# Patient Record
Sex: Female | Born: 1940 | Race: White | Hispanic: No | Marital: Married | Smoking: Never smoker
Health system: Southern US, Community
[De-identification: ages and names within clinical notes are randomized; demographics above are authoritative.]

## PROBLEM LIST (undated history)

## (undated) DIAGNOSIS — I4891 Unspecified atrial fibrillation: Secondary | ICD-10-CM

## (undated) DIAGNOSIS — G8929 Other chronic pain: Secondary | ICD-10-CM

## (undated) DIAGNOSIS — H269 Unspecified cataract: Secondary | ICD-10-CM

## (undated) DIAGNOSIS — M810 Age-related osteoporosis without current pathological fracture: Secondary | ICD-10-CM

## (undated) DIAGNOSIS — M549 Dorsalgia, unspecified: Secondary | ICD-10-CM

## (undated) DIAGNOSIS — I509 Heart failure, unspecified: Secondary | ICD-10-CM

## (undated) DIAGNOSIS — N189 Chronic kidney disease, unspecified: Secondary | ICD-10-CM

## (undated) DIAGNOSIS — I495 Sick sinus syndrome: Secondary | ICD-10-CM

## (undated) DIAGNOSIS — M1990 Unspecified osteoarthritis, unspecified site: Secondary | ICD-10-CM

## (undated) DIAGNOSIS — J449 Chronic obstructive pulmonary disease, unspecified: Secondary | ICD-10-CM

## (undated) HISTORY — DX: Unspecified cataract: H26.9

## (undated) HISTORY — DX: Unspecified atrial fibrillation: I48.91

## (undated) HISTORY — PX: PACEMAKER PLACEMENT: SHX43

## (undated) HISTORY — DX: Sick sinus syndrome: I49.5

## (undated) HISTORY — DX: Dorsalgia, unspecified: M54.9

## (undated) HISTORY — PX: EYE SURGERY: SHX253

## (undated) HISTORY — DX: Age-related osteoporosis without current pathological fracture: M81.0

## (undated) HISTORY — DX: Other chronic pain: G89.29

## (undated) HISTORY — PX: ABDOMINAL HYSTERECTOMY: SHX81

## (undated) HISTORY — DX: Chronic kidney disease, unspecified: N18.9

## (undated) HISTORY — DX: Chronic obstructive pulmonary disease, unspecified: J44.9

## (undated) HISTORY — DX: Heart failure, unspecified: I50.9

## (undated) HISTORY — PX: OTHER SURGICAL HISTORY: SHX169

## (undated) HISTORY — DX: Unspecified osteoarthritis, unspecified site: M19.90

---

## 1998-04-24 ENCOUNTER — Inpatient Hospital Stay (HOSPITAL_COMMUNITY): Admission: RE | Admit: 1998-04-24 | Discharge: 1998-04-27 | Payer: Self-pay | Admitting: Gynecology

## 2015-04-25 DIAGNOSIS — I495 Sick sinus syndrome: Secondary | ICD-10-CM | POA: Insufficient documentation

## 2015-04-25 DIAGNOSIS — Z950 Presence of cardiac pacemaker: Secondary | ICD-10-CM | POA: Insufficient documentation

## 2015-11-06 DIAGNOSIS — Z45010 Encounter for checking and testing of cardiac pacemaker pulse generator [battery]: Secondary | ICD-10-CM | POA: Diagnosis not present

## 2015-11-06 DIAGNOSIS — I498 Other specified cardiac arrhythmias: Secondary | ICD-10-CM | POA: Diagnosis not present

## 2015-11-08 DIAGNOSIS — Z79899 Other long term (current) drug therapy: Secondary | ICD-10-CM | POA: Diagnosis not present

## 2015-11-08 DIAGNOSIS — G894 Chronic pain syndrome: Secondary | ICD-10-CM | POA: Diagnosis not present

## 2016-02-05 DIAGNOSIS — F1120 Opioid dependence, uncomplicated: Secondary | ICD-10-CM | POA: Diagnosis not present

## 2016-02-05 DIAGNOSIS — G8929 Other chronic pain: Secondary | ICD-10-CM | POA: Diagnosis not present

## 2016-02-05 DIAGNOSIS — M5416 Radiculopathy, lumbar region: Secondary | ICD-10-CM | POA: Diagnosis not present

## 2016-02-05 DIAGNOSIS — M419 Scoliosis, unspecified: Secondary | ICD-10-CM | POA: Diagnosis not present

## 2016-02-05 DIAGNOSIS — M130 Polyarthritis, unspecified: Secondary | ICD-10-CM | POA: Diagnosis not present

## 2016-02-05 DIAGNOSIS — G894 Chronic pain syndrome: Secondary | ICD-10-CM | POA: Diagnosis not present

## 2016-02-05 DIAGNOSIS — Z5181 Encounter for therapeutic drug level monitoring: Secondary | ICD-10-CM | POA: Diagnosis not present

## 2016-02-05 DIAGNOSIS — M5440 Lumbago with sciatica, unspecified side: Secondary | ICD-10-CM | POA: Diagnosis not present

## 2016-02-06 DIAGNOSIS — I498 Other specified cardiac arrhythmias: Secondary | ICD-10-CM | POA: Diagnosis not present

## 2016-02-06 DIAGNOSIS — Z45018 Encounter for adjustment and management of other part of cardiac pacemaker: Secondary | ICD-10-CM | POA: Diagnosis not present

## 2016-03-25 DIAGNOSIS — M130 Polyarthritis, unspecified: Secondary | ICD-10-CM | POA: Diagnosis not present

## 2016-03-25 DIAGNOSIS — M5440 Lumbago with sciatica, unspecified side: Secondary | ICD-10-CM | POA: Diagnosis not present

## 2016-03-25 DIAGNOSIS — M5416 Radiculopathy, lumbar region: Secondary | ICD-10-CM | POA: Diagnosis not present

## 2016-03-25 DIAGNOSIS — G894 Chronic pain syndrome: Secondary | ICD-10-CM | POA: Diagnosis not present

## 2016-03-25 DIAGNOSIS — F1120 Opioid dependence, uncomplicated: Secondary | ICD-10-CM | POA: Diagnosis not present

## 2016-03-25 DIAGNOSIS — Z79899 Other long term (current) drug therapy: Secondary | ICD-10-CM | POA: Diagnosis not present

## 2016-03-25 DIAGNOSIS — G8929 Other chronic pain: Secondary | ICD-10-CM | POA: Diagnosis not present

## 2016-03-25 DIAGNOSIS — M419 Scoliosis, unspecified: Secondary | ICD-10-CM | POA: Diagnosis not present

## 2016-03-25 DIAGNOSIS — G2581 Restless legs syndrome: Secondary | ICD-10-CM | POA: Diagnosis not present

## 2016-04-22 DIAGNOSIS — M5416 Radiculopathy, lumbar region: Secondary | ICD-10-CM | POA: Diagnosis not present

## 2016-04-22 DIAGNOSIS — M5440 Lumbago with sciatica, unspecified side: Secondary | ICD-10-CM | POA: Diagnosis not present

## 2016-04-22 DIAGNOSIS — G8929 Other chronic pain: Secondary | ICD-10-CM | POA: Diagnosis not present

## 2016-04-22 DIAGNOSIS — Z79899 Other long term (current) drug therapy: Secondary | ICD-10-CM | POA: Diagnosis not present

## 2016-04-22 DIAGNOSIS — M419 Scoliosis, unspecified: Secondary | ICD-10-CM | POA: Diagnosis not present

## 2016-04-22 DIAGNOSIS — M130 Polyarthritis, unspecified: Secondary | ICD-10-CM | POA: Diagnosis not present

## 2016-04-22 DIAGNOSIS — G2581 Restless legs syndrome: Secondary | ICD-10-CM | POA: Diagnosis not present

## 2016-04-22 DIAGNOSIS — F1120 Opioid dependence, uncomplicated: Secondary | ICD-10-CM | POA: Diagnosis not present

## 2016-04-22 DIAGNOSIS — G894 Chronic pain syndrome: Secondary | ICD-10-CM | POA: Diagnosis not present

## 2016-05-12 DIAGNOSIS — Z950 Presence of cardiac pacemaker: Secondary | ICD-10-CM | POA: Diagnosis not present

## 2016-05-20 DIAGNOSIS — M810 Age-related osteoporosis without current pathological fracture: Secondary | ICD-10-CM | POA: Diagnosis not present

## 2016-05-20 DIAGNOSIS — Z1389 Encounter for screening for other disorder: Secondary | ICD-10-CM | POA: Diagnosis not present

## 2016-05-20 DIAGNOSIS — Z6834 Body mass index (BMI) 34.0-34.9, adult: Secondary | ICD-10-CM | POA: Diagnosis not present

## 2016-05-20 DIAGNOSIS — G2581 Restless legs syndrome: Secondary | ICD-10-CM | POA: Diagnosis not present

## 2016-05-20 DIAGNOSIS — M419 Scoliosis, unspecified: Secondary | ICD-10-CM | POA: Diagnosis not present

## 2016-05-20 DIAGNOSIS — G894 Chronic pain syndrome: Secondary | ICD-10-CM | POA: Diagnosis not present

## 2016-05-20 DIAGNOSIS — I482 Chronic atrial fibrillation: Secondary | ICD-10-CM | POA: Diagnosis not present

## 2016-05-20 DIAGNOSIS — H3530 Unspecified macular degeneration: Secondary | ICD-10-CM | POA: Diagnosis not present

## 2016-05-20 DIAGNOSIS — M069 Rheumatoid arthritis, unspecified: Secondary | ICD-10-CM | POA: Diagnosis not present

## 2016-05-20 DIAGNOSIS — Z0000 Encounter for general adult medical examination without abnormal findings: Secondary | ICD-10-CM | POA: Diagnosis not present

## 2016-05-20 DIAGNOSIS — E7800 Pure hypercholesterolemia, unspecified: Secondary | ICD-10-CM | POA: Diagnosis not present

## 2016-05-28 DIAGNOSIS — H35342 Macular cyst, hole, or pseudohole, left eye: Secondary | ICD-10-CM | POA: Diagnosis not present

## 2016-06-03 DIAGNOSIS — F1120 Opioid dependence, uncomplicated: Secondary | ICD-10-CM | POA: Diagnosis not present

## 2016-06-03 DIAGNOSIS — M419 Scoliosis, unspecified: Secondary | ICD-10-CM | POA: Diagnosis not present

## 2016-06-03 DIAGNOSIS — M5416 Radiculopathy, lumbar region: Secondary | ICD-10-CM | POA: Diagnosis not present

## 2016-06-03 DIAGNOSIS — G2581 Restless legs syndrome: Secondary | ICD-10-CM | POA: Diagnosis not present

## 2016-06-03 DIAGNOSIS — M130 Polyarthritis, unspecified: Secondary | ICD-10-CM | POA: Diagnosis not present

## 2016-06-03 DIAGNOSIS — G8929 Other chronic pain: Secondary | ICD-10-CM | POA: Diagnosis not present

## 2016-06-03 DIAGNOSIS — Z79899 Other long term (current) drug therapy: Secondary | ICD-10-CM | POA: Diagnosis not present

## 2016-06-03 DIAGNOSIS — G894 Chronic pain syndrome: Secondary | ICD-10-CM | POA: Diagnosis not present

## 2016-06-03 DIAGNOSIS — M5440 Lumbago with sciatica, unspecified side: Secondary | ICD-10-CM | POA: Diagnosis not present

## 2016-06-05 DIAGNOSIS — M810 Age-related osteoporosis without current pathological fracture: Secondary | ICD-10-CM | POA: Diagnosis not present

## 2016-06-05 DIAGNOSIS — M8589 Other specified disorders of bone density and structure, multiple sites: Secondary | ICD-10-CM | POA: Diagnosis not present

## 2016-06-19 DIAGNOSIS — M818 Other osteoporosis without current pathological fracture: Secondary | ICD-10-CM | POA: Diagnosis not present

## 2016-06-25 DIAGNOSIS — I495 Sick sinus syndrome: Secondary | ICD-10-CM | POA: Diagnosis not present

## 2016-07-01 DIAGNOSIS — G894 Chronic pain syndrome: Secondary | ICD-10-CM | POA: Diagnosis not present

## 2016-07-01 DIAGNOSIS — M130 Polyarthritis, unspecified: Secondary | ICD-10-CM | POA: Diagnosis not present

## 2016-07-01 DIAGNOSIS — M5440 Lumbago with sciatica, unspecified side: Secondary | ICD-10-CM | POA: Diagnosis not present

## 2016-07-01 DIAGNOSIS — G8929 Other chronic pain: Secondary | ICD-10-CM | POA: Diagnosis not present

## 2016-07-01 DIAGNOSIS — M419 Scoliosis, unspecified: Secondary | ICD-10-CM | POA: Diagnosis not present

## 2016-07-01 DIAGNOSIS — G2581 Restless legs syndrome: Secondary | ICD-10-CM | POA: Diagnosis not present

## 2016-07-01 DIAGNOSIS — M5416 Radiculopathy, lumbar region: Secondary | ICD-10-CM | POA: Diagnosis not present

## 2016-07-01 DIAGNOSIS — F1120 Opioid dependence, uncomplicated: Secondary | ICD-10-CM | POA: Diagnosis not present

## 2016-07-01 DIAGNOSIS — Z79899 Other long term (current) drug therapy: Secondary | ICD-10-CM | POA: Diagnosis not present

## 2016-07-07 DIAGNOSIS — H25812 Combined forms of age-related cataract, left eye: Secondary | ICD-10-CM | POA: Diagnosis not present

## 2016-07-07 DIAGNOSIS — Z01818 Encounter for other preprocedural examination: Secondary | ICD-10-CM | POA: Diagnosis not present

## 2016-07-10 DIAGNOSIS — Z950 Presence of cardiac pacemaker: Secondary | ICD-10-CM | POA: Diagnosis not present

## 2016-07-10 DIAGNOSIS — Z23 Encounter for immunization: Secondary | ICD-10-CM | POA: Diagnosis not present

## 2016-07-10 DIAGNOSIS — I495 Sick sinus syndrome: Secondary | ICD-10-CM | POA: Diagnosis not present

## 2016-07-20 DIAGNOSIS — I482 Chronic atrial fibrillation, unspecified: Secondary | ICD-10-CM | POA: Insufficient documentation

## 2016-07-20 DIAGNOSIS — Z7901 Long term (current) use of anticoagulants: Secondary | ICD-10-CM | POA: Insufficient documentation

## 2016-07-20 DIAGNOSIS — R0602 Shortness of breath: Secondary | ICD-10-CM | POA: Insufficient documentation

## 2016-07-21 DIAGNOSIS — Z7901 Long term (current) use of anticoagulants: Secondary | ICD-10-CM | POA: Diagnosis not present

## 2016-07-21 DIAGNOSIS — I482 Chronic atrial fibrillation: Secondary | ICD-10-CM | POA: Diagnosis not present

## 2016-07-21 DIAGNOSIS — I5032 Chronic diastolic (congestive) heart failure: Secondary | ICD-10-CM | POA: Diagnosis not present

## 2016-07-21 DIAGNOSIS — R0602 Shortness of breath: Secondary | ICD-10-CM | POA: Diagnosis not present

## 2016-07-21 DIAGNOSIS — Z950 Presence of cardiac pacemaker: Secondary | ICD-10-CM | POA: Diagnosis not present

## 2016-08-03 DIAGNOSIS — I5032 Chronic diastolic (congestive) heart failure: Secondary | ICD-10-CM | POA: Diagnosis not present

## 2016-08-03 DIAGNOSIS — I482 Chronic atrial fibrillation: Secondary | ICD-10-CM | POA: Diagnosis not present

## 2016-08-03 DIAGNOSIS — Z950 Presence of cardiac pacemaker: Secondary | ICD-10-CM | POA: Diagnosis not present

## 2016-08-03 DIAGNOSIS — Z7901 Long term (current) use of anticoagulants: Secondary | ICD-10-CM | POA: Diagnosis not present

## 2016-08-05 DIAGNOSIS — M5416 Radiculopathy, lumbar region: Secondary | ICD-10-CM | POA: Diagnosis not present

## 2016-08-05 DIAGNOSIS — M419 Scoliosis, unspecified: Secondary | ICD-10-CM | POA: Diagnosis not present

## 2016-08-05 DIAGNOSIS — G2581 Restless legs syndrome: Secondary | ICD-10-CM | POA: Diagnosis not present

## 2016-08-05 DIAGNOSIS — F1120 Opioid dependence, uncomplicated: Secondary | ICD-10-CM | POA: Diagnosis not present

## 2016-08-05 DIAGNOSIS — M130 Polyarthritis, unspecified: Secondary | ICD-10-CM | POA: Diagnosis not present

## 2016-08-05 DIAGNOSIS — G8929 Other chronic pain: Secondary | ICD-10-CM | POA: Diagnosis not present

## 2016-08-05 DIAGNOSIS — G894 Chronic pain syndrome: Secondary | ICD-10-CM | POA: Diagnosis not present

## 2016-08-05 DIAGNOSIS — M5440 Lumbago with sciatica, unspecified side: Secondary | ICD-10-CM | POA: Diagnosis not present

## 2016-08-24 DIAGNOSIS — I4891 Unspecified atrial fibrillation: Secondary | ICD-10-CM | POA: Diagnosis not present

## 2016-09-02 DIAGNOSIS — M5416 Radiculopathy, lumbar region: Secondary | ICD-10-CM | POA: Diagnosis not present

## 2016-09-02 DIAGNOSIS — G894 Chronic pain syndrome: Secondary | ICD-10-CM | POA: Diagnosis not present

## 2016-09-02 DIAGNOSIS — M5440 Lumbago with sciatica, unspecified side: Secondary | ICD-10-CM | POA: Diagnosis not present

## 2016-09-02 DIAGNOSIS — M130 Polyarthritis, unspecified: Secondary | ICD-10-CM | POA: Diagnosis not present

## 2016-09-02 DIAGNOSIS — M419 Scoliosis, unspecified: Secondary | ICD-10-CM | POA: Diagnosis not present

## 2016-09-02 DIAGNOSIS — F1120 Opioid dependence, uncomplicated: Secondary | ICD-10-CM | POA: Diagnosis not present

## 2016-09-02 DIAGNOSIS — G8929 Other chronic pain: Secondary | ICD-10-CM | POA: Diagnosis not present

## 2016-09-02 DIAGNOSIS — G2581 Restless legs syndrome: Secondary | ICD-10-CM | POA: Diagnosis not present

## 2016-09-15 DIAGNOSIS — Z01810 Encounter for preprocedural cardiovascular examination: Secondary | ICD-10-CM | POA: Insufficient documentation

## 2016-09-24 DIAGNOSIS — Z950 Presence of cardiac pacemaker: Secondary | ICD-10-CM | POA: Diagnosis not present

## 2016-09-24 DIAGNOSIS — Z45018 Encounter for adjustment and management of other part of cardiac pacemaker: Secondary | ICD-10-CM | POA: Diagnosis not present

## 2016-10-08 DIAGNOSIS — I482 Chronic atrial fibrillation: Secondary | ICD-10-CM | POA: Diagnosis not present

## 2016-10-08 DIAGNOSIS — Z950 Presence of cardiac pacemaker: Secondary | ICD-10-CM | POA: Diagnosis not present

## 2016-10-08 DIAGNOSIS — I5032 Chronic diastolic (congestive) heart failure: Secondary | ICD-10-CM | POA: Diagnosis not present

## 2016-10-08 DIAGNOSIS — Z01810 Encounter for preprocedural cardiovascular examination: Secondary | ICD-10-CM | POA: Diagnosis not present

## 2016-10-08 DIAGNOSIS — Z7901 Long term (current) use of anticoagulants: Secondary | ICD-10-CM | POA: Diagnosis not present

## 2016-10-26 DIAGNOSIS — I4891 Unspecified atrial fibrillation: Secondary | ICD-10-CM | POA: Diagnosis not present

## 2016-11-03 DIAGNOSIS — H35342 Macular cyst, hole, or pseudohole, left eye: Secondary | ICD-10-CM | POA: Diagnosis not present

## 2016-11-04 DIAGNOSIS — M5440 Lumbago with sciatica, unspecified side: Secondary | ICD-10-CM | POA: Diagnosis not present

## 2016-11-04 DIAGNOSIS — M419 Scoliosis, unspecified: Secondary | ICD-10-CM | POA: Diagnosis not present

## 2016-11-04 DIAGNOSIS — M130 Polyarthritis, unspecified: Secondary | ICD-10-CM | POA: Diagnosis not present

## 2016-11-04 DIAGNOSIS — M5416 Radiculopathy, lumbar region: Secondary | ICD-10-CM | POA: Diagnosis not present

## 2016-11-04 DIAGNOSIS — G2581 Restless legs syndrome: Secondary | ICD-10-CM | POA: Diagnosis not present

## 2016-11-04 DIAGNOSIS — F1120 Opioid dependence, uncomplicated: Secondary | ICD-10-CM | POA: Diagnosis not present

## 2016-11-04 DIAGNOSIS — G8929 Other chronic pain: Secondary | ICD-10-CM | POA: Diagnosis not present

## 2016-11-04 DIAGNOSIS — G894 Chronic pain syndrome: Secondary | ICD-10-CM | POA: Diagnosis not present

## 2016-11-25 DIAGNOSIS — I5032 Chronic diastolic (congestive) heart failure: Secondary | ICD-10-CM | POA: Diagnosis not present

## 2016-11-25 DIAGNOSIS — G894 Chronic pain syndrome: Secondary | ICD-10-CM | POA: Diagnosis not present

## 2016-11-25 DIAGNOSIS — I4891 Unspecified atrial fibrillation: Secondary | ICD-10-CM | POA: Diagnosis not present

## 2016-12-07 DIAGNOSIS — Z961 Presence of intraocular lens: Secondary | ICD-10-CM | POA: Diagnosis not present

## 2016-12-07 DIAGNOSIS — H25811 Combined forms of age-related cataract, right eye: Secondary | ICD-10-CM | POA: Diagnosis not present

## 2016-12-07 DIAGNOSIS — H35342 Macular cyst, hole, or pseudohole, left eye: Secondary | ICD-10-CM | POA: Diagnosis not present

## 2016-12-25 DIAGNOSIS — Z950 Presence of cardiac pacemaker: Secondary | ICD-10-CM | POA: Diagnosis not present

## 2016-12-30 DIAGNOSIS — M5440 Lumbago with sciatica, unspecified side: Secondary | ICD-10-CM | POA: Diagnosis not present

## 2016-12-30 DIAGNOSIS — M5416 Radiculopathy, lumbar region: Secondary | ICD-10-CM | POA: Diagnosis not present

## 2016-12-30 DIAGNOSIS — F1120 Opioid dependence, uncomplicated: Secondary | ICD-10-CM | POA: Diagnosis not present

## 2016-12-30 DIAGNOSIS — M419 Scoliosis, unspecified: Secondary | ICD-10-CM | POA: Diagnosis not present

## 2016-12-30 DIAGNOSIS — M130 Polyarthritis, unspecified: Secondary | ICD-10-CM | POA: Diagnosis not present

## 2016-12-30 DIAGNOSIS — Z79899 Other long term (current) drug therapy: Secondary | ICD-10-CM | POA: Diagnosis not present

## 2016-12-30 DIAGNOSIS — G2581 Restless legs syndrome: Secondary | ICD-10-CM | POA: Diagnosis not present

## 2016-12-30 DIAGNOSIS — G894 Chronic pain syndrome: Secondary | ICD-10-CM | POA: Diagnosis not present

## 2016-12-30 DIAGNOSIS — G8929 Other chronic pain: Secondary | ICD-10-CM | POA: Diagnosis not present

## 2017-02-11 DIAGNOSIS — H35342 Macular cyst, hole, or pseudohole, left eye: Secondary | ICD-10-CM | POA: Diagnosis not present

## 2017-03-03 DIAGNOSIS — F411 Generalized anxiety disorder: Secondary | ICD-10-CM | POA: Diagnosis not present

## 2017-03-03 DIAGNOSIS — F330 Major depressive disorder, recurrent, mild: Secondary | ICD-10-CM | POA: Diagnosis not present

## 2017-03-26 DIAGNOSIS — Z950 Presence of cardiac pacemaker: Secondary | ICD-10-CM | POA: Diagnosis not present

## 2017-03-31 DIAGNOSIS — M130 Polyarthritis, unspecified: Secondary | ICD-10-CM | POA: Diagnosis not present

## 2017-03-31 DIAGNOSIS — G2581 Restless legs syndrome: Secondary | ICD-10-CM | POA: Diagnosis not present

## 2017-03-31 DIAGNOSIS — M5416 Radiculopathy, lumbar region: Secondary | ICD-10-CM | POA: Diagnosis not present

## 2017-03-31 DIAGNOSIS — G8929 Other chronic pain: Secondary | ICD-10-CM | POA: Diagnosis not present

## 2017-03-31 DIAGNOSIS — G894 Chronic pain syndrome: Secondary | ICD-10-CM | POA: Diagnosis not present

## 2017-03-31 DIAGNOSIS — M419 Scoliosis, unspecified: Secondary | ICD-10-CM | POA: Diagnosis not present

## 2017-03-31 DIAGNOSIS — M5440 Lumbago with sciatica, unspecified side: Secondary | ICD-10-CM | POA: Diagnosis not present

## 2017-03-31 DIAGNOSIS — F1120 Opioid dependence, uncomplicated: Secondary | ICD-10-CM | POA: Diagnosis not present

## 2017-04-01 DIAGNOSIS — I495 Sick sinus syndrome: Secondary | ICD-10-CM | POA: Diagnosis not present

## 2017-04-01 DIAGNOSIS — I5033 Acute on chronic diastolic (congestive) heart failure: Secondary | ICD-10-CM | POA: Insufficient documentation

## 2017-04-01 DIAGNOSIS — I482 Chronic atrial fibrillation: Secondary | ICD-10-CM | POA: Diagnosis not present

## 2017-04-01 DIAGNOSIS — R5382 Chronic fatigue, unspecified: Secondary | ICD-10-CM | POA: Diagnosis not present

## 2017-04-01 DIAGNOSIS — I5032 Chronic diastolic (congestive) heart failure: Secondary | ICD-10-CM | POA: Diagnosis not present

## 2017-04-01 DIAGNOSIS — Z950 Presence of cardiac pacemaker: Secondary | ICD-10-CM | POA: Diagnosis not present

## 2017-04-01 DIAGNOSIS — Z7901 Long term (current) use of anticoagulants: Secondary | ICD-10-CM | POA: Diagnosis not present

## 2017-04-12 DIAGNOSIS — I509 Heart failure, unspecified: Secondary | ICD-10-CM | POA: Diagnosis not present

## 2017-06-01 DIAGNOSIS — H35342 Macular cyst, hole, or pseudohole, left eye: Secondary | ICD-10-CM | POA: Diagnosis not present

## 2017-06-07 DIAGNOSIS — R9431 Abnormal electrocardiogram [ECG] [EKG]: Secondary | ICD-10-CM | POA: Diagnosis not present

## 2017-06-07 DIAGNOSIS — I4891 Unspecified atrial fibrillation: Secondary | ICD-10-CM | POA: Diagnosis not present

## 2017-06-21 DIAGNOSIS — Z0000 Encounter for general adult medical examination without abnormal findings: Secondary | ICD-10-CM | POA: Diagnosis not present

## 2017-06-21 DIAGNOSIS — F411 Generalized anxiety disorder: Secondary | ICD-10-CM | POA: Diagnosis not present

## 2017-06-21 DIAGNOSIS — E7800 Pure hypercholesterolemia, unspecified: Secondary | ICD-10-CM | POA: Diagnosis not present

## 2017-06-21 DIAGNOSIS — Z23 Encounter for immunization: Secondary | ICD-10-CM | POA: Diagnosis not present

## 2017-06-21 DIAGNOSIS — I4891 Unspecified atrial fibrillation: Secondary | ICD-10-CM | POA: Diagnosis not present

## 2017-06-21 DIAGNOSIS — Z1389 Encounter for screening for other disorder: Secondary | ICD-10-CM | POA: Diagnosis not present

## 2017-06-23 DIAGNOSIS — Z79899 Other long term (current) drug therapy: Secondary | ICD-10-CM | POA: Diagnosis not present

## 2017-06-23 DIAGNOSIS — E7800 Pure hypercholesterolemia, unspecified: Secondary | ICD-10-CM | POA: Diagnosis not present

## 2017-06-23 DIAGNOSIS — G8929 Other chronic pain: Secondary | ICD-10-CM | POA: Diagnosis not present

## 2017-06-23 DIAGNOSIS — M5440 Lumbago with sciatica, unspecified side: Secondary | ICD-10-CM | POA: Diagnosis not present

## 2017-06-23 DIAGNOSIS — M419 Scoliosis, unspecified: Secondary | ICD-10-CM | POA: Diagnosis not present

## 2017-06-23 DIAGNOSIS — M5416 Radiculopathy, lumbar region: Secondary | ICD-10-CM | POA: Diagnosis not present

## 2017-06-23 DIAGNOSIS — G2581 Restless legs syndrome: Secondary | ICD-10-CM | POA: Diagnosis not present

## 2017-06-23 DIAGNOSIS — F1120 Opioid dependence, uncomplicated: Secondary | ICD-10-CM | POA: Diagnosis not present

## 2017-06-23 DIAGNOSIS — Z0000 Encounter for general adult medical examination without abnormal findings: Secondary | ICD-10-CM | POA: Diagnosis not present

## 2017-06-23 DIAGNOSIS — M130 Polyarthritis, unspecified: Secondary | ICD-10-CM | POA: Diagnosis not present

## 2017-06-23 DIAGNOSIS — F411 Generalized anxiety disorder: Secondary | ICD-10-CM | POA: Diagnosis not present

## 2017-06-23 DIAGNOSIS — I4891 Unspecified atrial fibrillation: Secondary | ICD-10-CM | POA: Diagnosis not present

## 2017-07-06 DIAGNOSIS — R000 Tachycardia, unspecified: Secondary | ICD-10-CM | POA: Diagnosis not present

## 2017-07-06 DIAGNOSIS — R0602 Shortness of breath: Secondary | ICD-10-CM | POA: Diagnosis not present

## 2017-07-06 DIAGNOSIS — N189 Chronic kidney disease, unspecified: Secondary | ICD-10-CM | POA: Diagnosis not present

## 2017-07-06 DIAGNOSIS — M810 Age-related osteoporosis without current pathological fracture: Secondary | ICD-10-CM | POA: Diagnosis not present

## 2017-07-06 DIAGNOSIS — I4891 Unspecified atrial fibrillation: Secondary | ICD-10-CM | POA: Diagnosis not present

## 2017-07-06 DIAGNOSIS — M7989 Other specified soft tissue disorders: Secondary | ICD-10-CM | POA: Diagnosis not present

## 2017-07-06 DIAGNOSIS — N179 Acute kidney failure, unspecified: Secondary | ICD-10-CM | POA: Diagnosis not present

## 2017-07-06 DIAGNOSIS — R0600 Dyspnea, unspecified: Secondary | ICD-10-CM | POA: Diagnosis not present

## 2017-07-06 DIAGNOSIS — Z79899 Other long term (current) drug therapy: Secondary | ICD-10-CM | POA: Diagnosis not present

## 2017-07-06 DIAGNOSIS — I509 Heart failure, unspecified: Secondary | ICD-10-CM | POA: Diagnosis not present

## 2017-07-06 DIAGNOSIS — M419 Scoliosis, unspecified: Secondary | ICD-10-CM | POA: Diagnosis not present

## 2017-07-06 DIAGNOSIS — R0789 Other chest pain: Secondary | ICD-10-CM | POA: Diagnosis not present

## 2017-07-06 DIAGNOSIS — Z950 Presence of cardiac pacemaker: Secondary | ICD-10-CM | POA: Diagnosis not present

## 2017-07-06 DIAGNOSIS — R069 Unspecified abnormalities of breathing: Secondary | ICD-10-CM | POA: Diagnosis not present

## 2017-07-06 DIAGNOSIS — Z7901 Long term (current) use of anticoagulants: Secondary | ICD-10-CM | POA: Diagnosis not present

## 2017-07-06 DIAGNOSIS — I081 Rheumatic disorders of both mitral and tricuspid valves: Secondary | ICD-10-CM | POA: Diagnosis not present

## 2017-07-06 DIAGNOSIS — R9431 Abnormal electrocardiogram [ECG] [EKG]: Secondary | ICD-10-CM | POA: Diagnosis not present

## 2017-07-06 DIAGNOSIS — I5033 Acute on chronic diastolic (congestive) heart failure: Secondary | ICD-10-CM | POA: Diagnosis not present

## 2017-07-07 DIAGNOSIS — N179 Acute kidney failure, unspecified: Secondary | ICD-10-CM | POA: Insufficient documentation

## 2017-07-07 DIAGNOSIS — R000 Tachycardia, unspecified: Secondary | ICD-10-CM | POA: Insufficient documentation

## 2017-07-08 DIAGNOSIS — Z45018 Encounter for adjustment and management of other part of cardiac pacemaker: Secondary | ICD-10-CM | POA: Diagnosis not present

## 2017-07-08 DIAGNOSIS — I5033 Acute on chronic diastolic (congestive) heart failure: Secondary | ICD-10-CM | POA: Diagnosis not present

## 2017-07-08 DIAGNOSIS — I420 Dilated cardiomyopathy: Secondary | ICD-10-CM | POA: Insufficient documentation

## 2017-07-08 DIAGNOSIS — I5032 Chronic diastolic (congestive) heart failure: Secondary | ICD-10-CM | POA: Diagnosis not present

## 2017-07-08 DIAGNOSIS — I481 Persistent atrial fibrillation: Secondary | ICD-10-CM | POA: Diagnosis not present

## 2017-07-08 DIAGNOSIS — I480 Paroxysmal atrial fibrillation: Secondary | ICD-10-CM | POA: Insufficient documentation

## 2017-07-27 DIAGNOSIS — I481 Persistent atrial fibrillation: Secondary | ICD-10-CM | POA: Diagnosis not present

## 2017-07-27 DIAGNOSIS — I5032 Chronic diastolic (congestive) heart failure: Secondary | ICD-10-CM | POA: Diagnosis not present

## 2017-07-27 DIAGNOSIS — I495 Sick sinus syndrome: Secondary | ICD-10-CM | POA: Diagnosis not present

## 2017-07-27 DIAGNOSIS — I493 Ventricular premature depolarization: Secondary | ICD-10-CM | POA: Diagnosis not present

## 2017-07-27 DIAGNOSIS — I420 Dilated cardiomyopathy: Secondary | ICD-10-CM | POA: Diagnosis not present

## 2017-08-16 DIAGNOSIS — Z79899 Other long term (current) drug therapy: Secondary | ICD-10-CM | POA: Diagnosis not present

## 2017-08-16 DIAGNOSIS — I495 Sick sinus syndrome: Secondary | ICD-10-CM | POA: Diagnosis not present

## 2017-08-18 DIAGNOSIS — M5416 Radiculopathy, lumbar region: Secondary | ICD-10-CM | POA: Diagnosis not present

## 2017-08-18 DIAGNOSIS — G2581 Restless legs syndrome: Secondary | ICD-10-CM | POA: Diagnosis not present

## 2017-08-18 DIAGNOSIS — M5440 Lumbago with sciatica, unspecified side: Secondary | ICD-10-CM | POA: Diagnosis not present

## 2017-08-18 DIAGNOSIS — M419 Scoliosis, unspecified: Secondary | ICD-10-CM | POA: Diagnosis not present

## 2017-08-18 DIAGNOSIS — G8929 Other chronic pain: Secondary | ICD-10-CM | POA: Diagnosis not present

## 2017-08-18 DIAGNOSIS — Z79899 Other long term (current) drug therapy: Secondary | ICD-10-CM | POA: Diagnosis not present

## 2017-08-18 DIAGNOSIS — M130 Polyarthritis, unspecified: Secondary | ICD-10-CM | POA: Diagnosis not present

## 2017-08-18 DIAGNOSIS — F1120 Opioid dependence, uncomplicated: Secondary | ICD-10-CM | POA: Diagnosis not present

## 2017-09-04 DIAGNOSIS — R05 Cough: Secondary | ICD-10-CM | POA: Diagnosis not present

## 2017-09-04 DIAGNOSIS — J309 Allergic rhinitis, unspecified: Secondary | ICD-10-CM | POA: Diagnosis not present

## 2017-10-18 DIAGNOSIS — I480 Paroxysmal atrial fibrillation: Secondary | ICD-10-CM | POA: Diagnosis not present

## 2017-10-19 DIAGNOSIS — Z950 Presence of cardiac pacemaker: Secondary | ICD-10-CM | POA: Diagnosis not present

## 2017-10-19 DIAGNOSIS — I4891 Unspecified atrial fibrillation: Secondary | ICD-10-CM | POA: Diagnosis not present

## 2017-10-19 DIAGNOSIS — I481 Persistent atrial fibrillation: Secondary | ICD-10-CM | POA: Diagnosis not present

## 2017-11-10 DIAGNOSIS — G2581 Restless legs syndrome: Secondary | ICD-10-CM | POA: Diagnosis not present

## 2017-11-10 DIAGNOSIS — M5416 Radiculopathy, lumbar region: Secondary | ICD-10-CM | POA: Diagnosis not present

## 2017-11-10 DIAGNOSIS — M419 Scoliosis, unspecified: Secondary | ICD-10-CM | POA: Diagnosis not present

## 2017-11-10 DIAGNOSIS — M5440 Lumbago with sciatica, unspecified side: Secondary | ICD-10-CM | POA: Diagnosis not present

## 2017-11-10 DIAGNOSIS — Z79899 Other long term (current) drug therapy: Secondary | ICD-10-CM | POA: Diagnosis not present

## 2017-11-10 DIAGNOSIS — G8929 Other chronic pain: Secondary | ICD-10-CM | POA: Diagnosis not present

## 2017-11-10 DIAGNOSIS — F1120 Opioid dependence, uncomplicated: Secondary | ICD-10-CM | POA: Diagnosis not present

## 2017-11-10 DIAGNOSIS — M130 Polyarthritis, unspecified: Secondary | ICD-10-CM | POA: Diagnosis not present

## 2017-11-22 DIAGNOSIS — Z950 Presence of cardiac pacemaker: Secondary | ICD-10-CM | POA: Diagnosis not present

## 2017-12-07 DIAGNOSIS — Z961 Presence of intraocular lens: Secondary | ICD-10-CM | POA: Diagnosis not present

## 2017-12-07 DIAGNOSIS — H25811 Combined forms of age-related cataract, right eye: Secondary | ICD-10-CM | POA: Diagnosis not present

## 2017-12-07 DIAGNOSIS — H35342 Macular cyst, hole, or pseudohole, left eye: Secondary | ICD-10-CM | POA: Diagnosis not present

## 2017-12-08 DIAGNOSIS — I5033 Acute on chronic diastolic (congestive) heart failure: Secondary | ICD-10-CM | POA: Diagnosis not present

## 2017-12-08 DIAGNOSIS — I420 Dilated cardiomyopathy: Secondary | ICD-10-CM | POA: Diagnosis not present

## 2017-12-08 DIAGNOSIS — I5032 Chronic diastolic (congestive) heart failure: Secondary | ICD-10-CM | POA: Diagnosis not present

## 2017-12-08 DIAGNOSIS — I481 Persistent atrial fibrillation: Secondary | ICD-10-CM | POA: Diagnosis not present

## 2017-12-08 DIAGNOSIS — I495 Sick sinus syndrome: Secondary | ICD-10-CM | POA: Diagnosis not present

## 2018-01-13 DIAGNOSIS — I272 Pulmonary hypertension, unspecified: Secondary | ICD-10-CM | POA: Diagnosis not present

## 2018-01-13 DIAGNOSIS — I340 Nonrheumatic mitral (valve) insufficiency: Secondary | ICD-10-CM | POA: Diagnosis not present

## 2018-01-13 DIAGNOSIS — I481 Persistent atrial fibrillation: Secondary | ICD-10-CM | POA: Diagnosis not present

## 2018-01-13 DIAGNOSIS — I517 Cardiomegaly: Secondary | ICD-10-CM | POA: Diagnosis not present

## 2018-02-09 DIAGNOSIS — Z79899 Other long term (current) drug therapy: Secondary | ICD-10-CM | POA: Diagnosis not present

## 2018-02-09 DIAGNOSIS — M419 Scoliosis, unspecified: Secondary | ICD-10-CM | POA: Diagnosis not present

## 2018-02-09 DIAGNOSIS — F1120 Opioid dependence, uncomplicated: Secondary | ICD-10-CM | POA: Diagnosis not present

## 2018-02-09 DIAGNOSIS — M5416 Radiculopathy, lumbar region: Secondary | ICD-10-CM | POA: Diagnosis not present

## 2018-02-09 DIAGNOSIS — G2581 Restless legs syndrome: Secondary | ICD-10-CM | POA: Diagnosis not present

## 2018-02-09 DIAGNOSIS — M130 Polyarthritis, unspecified: Secondary | ICD-10-CM | POA: Diagnosis not present

## 2018-02-09 DIAGNOSIS — G8929 Other chronic pain: Secondary | ICD-10-CM | POA: Diagnosis not present

## 2018-02-09 DIAGNOSIS — M5440 Lumbago with sciatica, unspecified side: Secondary | ICD-10-CM | POA: Diagnosis not present

## 2018-02-16 DIAGNOSIS — Z950 Presence of cardiac pacemaker: Secondary | ICD-10-CM | POA: Diagnosis not present

## 2018-02-16 DIAGNOSIS — Z79899 Other long term (current) drug therapy: Secondary | ICD-10-CM | POA: Diagnosis not present

## 2018-02-16 DIAGNOSIS — R0602 Shortness of breath: Secondary | ICD-10-CM | POA: Diagnosis not present

## 2018-02-16 DIAGNOSIS — R002 Palpitations: Secondary | ICD-10-CM | POA: Diagnosis not present

## 2018-02-16 DIAGNOSIS — R069 Unspecified abnormalities of breathing: Secondary | ICD-10-CM | POA: Diagnosis not present

## 2018-02-18 DIAGNOSIS — Z950 Presence of cardiac pacemaker: Secondary | ICD-10-CM | POA: Diagnosis not present

## 2018-03-09 DIAGNOSIS — I481 Persistent atrial fibrillation: Secondary | ICD-10-CM | POA: Diagnosis not present

## 2018-03-09 DIAGNOSIS — Z45018 Encounter for adjustment and management of other part of cardiac pacemaker: Secondary | ICD-10-CM | POA: Diagnosis not present

## 2018-03-09 DIAGNOSIS — N179 Acute kidney failure, unspecified: Secondary | ICD-10-CM | POA: Diagnosis not present

## 2018-03-09 DIAGNOSIS — I495 Sick sinus syndrome: Secondary | ICD-10-CM | POA: Diagnosis not present

## 2018-03-10 DIAGNOSIS — I495 Sick sinus syndrome: Secondary | ICD-10-CM | POA: Diagnosis not present

## 2018-04-13 DIAGNOSIS — I481 Persistent atrial fibrillation: Secondary | ICD-10-CM | POA: Diagnosis not present

## 2018-04-13 DIAGNOSIS — I130 Hypertensive heart and chronic kidney disease with heart failure and stage 1 through stage 4 chronic kidney disease, or unspecified chronic kidney disease: Secondary | ICD-10-CM | POA: Diagnosis not present

## 2018-04-13 DIAGNOSIS — G894 Chronic pain syndrome: Secondary | ICD-10-CM | POA: Diagnosis not present

## 2018-04-13 DIAGNOSIS — M1990 Unspecified osteoarthritis, unspecified site: Secondary | ICD-10-CM | POA: Diagnosis not present

## 2018-04-13 DIAGNOSIS — R0602 Shortness of breath: Secondary | ICD-10-CM | POA: Diagnosis not present

## 2018-04-13 DIAGNOSIS — J441 Chronic obstructive pulmonary disease with (acute) exacerbation: Secondary | ICD-10-CM | POA: Diagnosis not present

## 2018-04-13 DIAGNOSIS — I081 Rheumatic disorders of both mitral and tricuspid valves: Secondary | ICD-10-CM | POA: Diagnosis not present

## 2018-04-13 DIAGNOSIS — R918 Other nonspecific abnormal finding of lung field: Secondary | ICD-10-CM | POA: Diagnosis not present

## 2018-04-13 DIAGNOSIS — I4891 Unspecified atrial fibrillation: Secondary | ICD-10-CM | POA: Diagnosis not present

## 2018-04-13 DIAGNOSIS — Z79899 Other long term (current) drug therapy: Secondary | ICD-10-CM | POA: Diagnosis not present

## 2018-04-13 DIAGNOSIS — T502X5A Adverse effect of carbonic-anhydrase inhibitors, benzothiadiazides and other diuretics, initial encounter: Secondary | ICD-10-CM | POA: Diagnosis not present

## 2018-04-13 DIAGNOSIS — I5031 Acute diastolic (congestive) heart failure: Secondary | ICD-10-CM | POA: Diagnosis not present

## 2018-04-13 DIAGNOSIS — I952 Hypotension due to drugs: Secondary | ICD-10-CM | POA: Diagnosis not present

## 2018-04-13 DIAGNOSIS — T461X5A Adverse effect of calcium-channel blockers, initial encounter: Secondary | ICD-10-CM | POA: Diagnosis not present

## 2018-04-13 DIAGNOSIS — J069 Acute upper respiratory infection, unspecified: Secondary | ICD-10-CM | POA: Diagnosis not present

## 2018-04-13 DIAGNOSIS — G8929 Other chronic pain: Secondary | ICD-10-CM | POA: Diagnosis not present

## 2018-04-13 DIAGNOSIS — Z7409 Other reduced mobility: Secondary | ICD-10-CM | POA: Diagnosis not present

## 2018-04-13 DIAGNOSIS — Z833 Family history of diabetes mellitus: Secondary | ICD-10-CM | POA: Diagnosis not present

## 2018-04-13 DIAGNOSIS — J90 Pleural effusion, not elsewhere classified: Secondary | ICD-10-CM | POA: Diagnosis not present

## 2018-04-13 DIAGNOSIS — M069 Rheumatoid arthritis, unspecified: Secondary | ICD-10-CM | POA: Diagnosis not present

## 2018-04-13 DIAGNOSIS — N179 Acute kidney failure, unspecified: Secondary | ICD-10-CM | POA: Diagnosis not present

## 2018-04-13 DIAGNOSIS — I5033 Acute on chronic diastolic (congestive) heart failure: Secondary | ICD-10-CM | POA: Diagnosis not present

## 2018-04-13 DIAGNOSIS — I2729 Other secondary pulmonary hypertension: Secondary | ICD-10-CM | POA: Diagnosis not present

## 2018-04-13 DIAGNOSIS — Z8249 Family history of ischemic heart disease and other diseases of the circulatory system: Secondary | ICD-10-CM | POA: Diagnosis not present

## 2018-04-13 DIAGNOSIS — E785 Hyperlipidemia, unspecified: Secondary | ICD-10-CM | POA: Diagnosis not present

## 2018-04-13 DIAGNOSIS — M4120 Other idiopathic scoliosis, site unspecified: Secondary | ICD-10-CM | POA: Diagnosis not present

## 2018-04-13 DIAGNOSIS — N183 Chronic kidney disease, stage 3 (moderate): Secondary | ICD-10-CM | POA: Diagnosis not present

## 2018-04-13 DIAGNOSIS — Z950 Presence of cardiac pacemaker: Secondary | ICD-10-CM | POA: Diagnosis not present

## 2018-04-13 DIAGNOSIS — M810 Age-related osteoporosis without current pathological fracture: Secondary | ICD-10-CM | POA: Diagnosis not present

## 2018-04-13 DIAGNOSIS — R0601 Orthopnea: Secondary | ICD-10-CM | POA: Diagnosis not present

## 2018-04-13 DIAGNOSIS — I495 Sick sinus syndrome: Secondary | ICD-10-CM | POA: Diagnosis not present

## 2018-04-13 DIAGNOSIS — I482 Chronic atrial fibrillation: Secondary | ICD-10-CM | POA: Diagnosis not present

## 2018-04-13 DIAGNOSIS — R069 Unspecified abnormalities of breathing: Secondary | ICD-10-CM | POA: Diagnosis not present

## 2018-04-13 DIAGNOSIS — M4126 Other idiopathic scoliosis, lumbar region: Secondary | ICD-10-CM | POA: Diagnosis not present

## 2018-04-13 DIAGNOSIS — I5040 Unspecified combined systolic (congestive) and diastolic (congestive) heart failure: Secondary | ICD-10-CM | POA: Diagnosis not present

## 2018-04-13 DIAGNOSIS — J9801 Acute bronchospasm: Secondary | ICD-10-CM | POA: Diagnosis not present

## 2018-04-13 DIAGNOSIS — D649 Anemia, unspecified: Secondary | ICD-10-CM | POA: Diagnosis not present

## 2018-04-13 DIAGNOSIS — Z7901 Long term (current) use of anticoagulants: Secondary | ICD-10-CM | POA: Diagnosis not present

## 2018-04-13 DIAGNOSIS — I420 Dilated cardiomyopathy: Secondary | ICD-10-CM | POA: Diagnosis not present

## 2018-04-26 ENCOUNTER — Other Ambulatory Visit: Payer: Self-pay

## 2018-04-26 DIAGNOSIS — J441 Chronic obstructive pulmonary disease with (acute) exacerbation: Secondary | ICD-10-CM | POA: Insufficient documentation

## 2018-04-26 DIAGNOSIS — M549 Dorsalgia, unspecified: Secondary | ICD-10-CM

## 2018-04-26 DIAGNOSIS — G8929 Other chronic pain: Secondary | ICD-10-CM | POA: Insufficient documentation

## 2018-04-26 NOTE — Patient Outreach (Signed)
[place] [facility]) Care Management  04/26/2018  Kristen Harrison 17-Sep-1941 [id number]   Referral Date: 04/25/18 Referral Source: HTA report Date of Admission: 04/13/18 Diagnosis: CHF Date of Discharge: 04/22/18 Facility:  [place]: HTA  Outreach attempt # 1 spoke with patient.  She is able to verify HIPAA.  Patient reports that she is doing good since being discharged.  Patient reports  She is weighing daily and weight today was 159 lbs.  Patient reports that she is also watching her salt intake. Patient states that she is supposed to have home health but they have not come.  CM will follow up with home health. Patient reports that she has questions about her fluid restriction.  Patient has a fluid restriction of 1535ml per day.  Discussed with patient equivalent in ounces. Patient able to understand and thankful for the education.     Social: Patient lives alone but has 2 daughter that come to assist her and take her to appointments.  Conditions: Patient recently admitted with CHF.  Patient sees Dr. Ola Spurr for cardiology.  Patient also has COPD, A, Fib, and Chronic Kidney Disease.    Medications: Patient has her medications and is able to review.    Appointments: Patient did not have appointments set up.  Assisted patient is setting up appointments with PCP on Friday and Cardiologist on 05-04-18.    Consent: RN CM reviewed [facility] services with patient. Patient agreeable to services.   Plan:  [facility] CM Care Plan Problem One     Most Recent Value  Care Plan Problem One  Recent Hospitalization: Heart Failure  Role Documenting the Problem One  Care Management Telephonic Coordinator  Care Plan for Problem One  Active  THN Long Term Goal   Patient will not readmit to the hospital within 30 days.  THN Long Term Goal Start Date  04/26/18  Interventions for Problem One Long Term Goal  RN CM reviewed with patient heart failure zones.  Patient has heart failure  zone chart.  Discussed the importance of follow up appointments  THN CM Short Term Goal #1   Patient will report weighing daily and recording weights within 30 days.  THN CM Short Term Goal #1 Start Date  04/26/18  Interventions for Short Term Goal #1  RN CM discussed with patient weight perimeters and when to notify physician.    THN CM Short Term Goal #2   Patient will be able to verbalize signs of heart failure and when to notify physician within 30 days.  THN CM Short Term Goal #2 Start Date  04/26/18  Interventions for Short Term Goal #2  RN CM reviewed with patient signs of heart failure and stressed the importance of notifying physician of changes.        RN CM will send note and barriers letter to physician. RN CM will mail patient [facility] packet with heart failure information.   RN CM will contact patient next week and patient agrees to next outreach.    Home Health: [facility] [phone] they do have the initial order for home health but states that patient needs to see PCP before he will sign orders.  Advised them that patient has appointment with him on Friday.  They will plan to see patient on Saturday.    Telephone call to patient to advise of home health status.    Jone Baseman, RN, MSN [place] Management Care Management Coordinator Direct Line [phone] Toll Free: [phone]  Fax: [phone]

## 2018-04-29 DIAGNOSIS — I5032 Chronic diastolic (congestive) heart failure: Secondary | ICD-10-CM | POA: Diagnosis not present

## 2018-04-29 DIAGNOSIS — R531 Weakness: Secondary | ICD-10-CM | POA: Diagnosis not present

## 2018-05-03 ENCOUNTER — Other Ambulatory Visit: Payer: Self-pay | Admitting: *Deleted

## 2018-05-03 NOTE — Patient Outreach (Addendum)
[facility] [facility]) Care Management  05/03/2018  Hendrix Console 1940-12-14 [id number]   Transition of care, week 2 Referral Date: 04/25/18 Referral Source: HTA report Date of Admission: 04/13/18 Diagnosis: CHF Date of Discharge: 04/22/18 Facility:  [place]: Health team advantage   Outreach attempt #1 to home numbers listed in Epic - to follow up on home health , fluid restriction and kidney MD appointment  At [phone] there was not an answer and [facility] RN CM was unable to leave a voice message as mail box was not set up Call attempt to [phone] and a female informed THN RN CM she had the wrong number   Plan: [facility] RN CM will update assigned THN RN CM, D Leath an unsuccessful outreach letter was sent and attempt another call to Mrs Chesmore within 3 business days  [employer]. Lavina Hamman, RN, BSN, CCM [facility] Telephonic Care Management Care Coordinator Direct number [phone]  Main [facility] number [phone] Fax number [phone]

## 2018-05-04 DIAGNOSIS — Z7901 Long term (current) use of anticoagulants: Secondary | ICD-10-CM | POA: Diagnosis not present

## 2018-05-04 DIAGNOSIS — I420 Dilated cardiomyopathy: Secondary | ICD-10-CM | POA: Diagnosis not present

## 2018-05-04 DIAGNOSIS — N179 Acute kidney failure, unspecified: Secondary | ICD-10-CM | POA: Diagnosis not present

## 2018-05-04 DIAGNOSIS — Z45018 Encounter for adjustment and management of other part of cardiac pacemaker: Secondary | ICD-10-CM | POA: Diagnosis not present

## 2018-05-04 DIAGNOSIS — I480 Paroxysmal atrial fibrillation: Secondary | ICD-10-CM | POA: Diagnosis not present

## 2018-05-05 ENCOUNTER — Other Ambulatory Visit: Payer: Self-pay | Admitting: *Deleted

## 2018-05-05 DIAGNOSIS — I480 Paroxysmal atrial fibrillation: Secondary | ICD-10-CM | POA: Diagnosis not present

## 2018-05-05 NOTE — Patient Outreach (Signed)
[place] [facility]) Care Management  05/05/2018  Kristen Harrison 08/05/1941 [id number]   Transition of care, week 2 Referral Date:04/25/18 Referral Source:HTA report Date of Admission:04/13/18 Diagnosis:CHF Date of Discharge:04/22/18 Facility:[facility] Insurance: Health team advantage   Outreach attempt #1 to home numbers listed in Epic - to follow up on home health , fluid restriction and kidney MD appointment [facility] RN CM was able to reach Kristen Harrison today at [phone]  Kristen Harrison reports she is doing well and has been to see her primary MD("I believe it was on Tuesday" and her cardiologist (on May 04, 2018 "yesterday") this week with "good reports"  She has been maintaining her fluid restriction and wt today was 158.6 on her scale and on the cardilogoist scale on 05/04/18 it was 160.4 lbs. She states her cardiologist had to "slow my heart rate down twice and did a EKG. I did not like that" She denies s/s of CHF to include swelling of her ankles as the CHF yellow zone s/s were reviewed  Home Health She has seen her primary MD, Dr Nona Dell but has not heard from [facility] for home health. She reports concerns with MD office and at this time she is not sure she needs home health since she reports she is doing well with the assistance of her daughter who is present today also (and her husband)  Kidney MD f/u is scheduled for 05/25/18  Plan: [facility] RN CM will update assigned THN RN CM, D Leath to continue to follow up with Kristen Harrison and she agrees to follow up [facility] RN CM called [facility] [phone] and spoke with Zigmund Daniel at [place] who states [facility] had not received orders from pcp for services [facility] RN CM called to Dr Nona Dell office but found out on Thursdays the office closes at [place] Problem One     Most Recent Value  Care Plan Problem One  Recent Hospitalization: Heart Failure  Role Documenting the Problem One  Care Management Telephonic Coordinator   Care Plan for Problem One  Active  [facility] Long Term Goal   Patient will not readmit to the hospital within 30 days.  THN Long Term Goal Start Date  04/26/18  Interventions for Problem One Long Term Goal  RN CM assessed CHF s/s, re reviewed CHF yellow zone s/s and action plan  THN CM Short Term Goal #1   Patient will report weighing daily and recording weights within 30 days.  THN CM Short Term Goal #1 Start Date  04/26/18  Interventions for Short Term Goal #1  RN CM re review importance of calling MD if wt >3-5 lbs and commended her for following fluid restriction and monitoring for edema  THN CM Short Term Goal #2   Patient will be able to verbalize signs of heart failure and when to notify physician within 30 days.  THN CM Short Term Goal #2 Start Date  04/26/18  Interventions for Short Term Goal #2  RN CM re review importance of calling MD if wt >3-5 lbs and commended her for following fluid restriction and monitoring for edema      Kimberly L. Lavina Hamman, RN, BSN, CCM [facility] Telephonic Care Management Care Coordinator Direct number [phone]  Main [facility] number [phone] Fax number [phone]

## 2018-05-10 ENCOUNTER — Other Ambulatory Visit: Payer: Self-pay

## 2018-05-10 NOTE — Patient Outreach (Signed)
[place] [facility]) Care Management  [place]  05/10/2018   Kristen Harrison 1940-11-16 [id number]  Subjective: Telephone call to patient for last weekly phone call to transition of care.  Spoke with patient she is able to verify HIPAA.  Patient reports that she is doing good.  She reports her weight is stable only about a 1 lb difference up or down.  Patient able to described signs of active heart failure.  Stressed the importance of notifying physician.  Also reviewed with patient fluid restriction and sal intake.  She verbalized understanding and voices no concerns.    Objective:   Encounter Medications:  Outpatient Encounter Medications as of 05/10/2018  Medication Sig  . amiodarone (PACERONE) 200 MG tablet Take 200 mg by mouth daily.  Marland Kitchen CRANBERRY EXTRACT PO Take by mouth daily.  Marland Kitchen diltiazem (CARDIZEM CD) 120 MG 24 hr capsule Take by mouth.  . DOCOSAHEXAENOIC ACID PO Take by mouth.  . docusate sodium (COLACE) 100 MG capsule Take by mouth.  . furosemide (LASIX) 40 MG tablet TAKE 1 TABLET BY MOUTH EVERY DAY  . gabapentin (NEURONTIN) 300 MG capsule Take by mouth.  . Multiple Vitamin (MULTI-VITAMINS) TABS Take by mouth.  Marland Kitchen oxymorphone (OPANA) 5 MG tablet Take by mouth.  . Rivaroxaban (XARELTO) 15 MG TABS tablet Take by mouth.  . sertraline (ZOLOFT) 25 MG tablet Take by mouth.   No facility-administered encounter medications on file as of 05/10/2018.     Functional Status:  In your present state of health, do you have any difficulty performing the following activities: 04/26/2018  Hearing? N  Vision? N  Difficulty concentrating or making decisions? N  Walking or climbing stairs? Y  Dressing or bathing? N  Doing errands, shopping? Y  Preparing Food and eating ? N  Using the Toilet? N  In the past six months, have you accidently leaked urine? N  Do you have problems with loss of bowel control? N  Managing your Medications? N  Managing your Finances? N   Housekeeping or managing your Housekeeping? N  Some recent data might be hidden    Fall/Depression Screening: Fall Risk  04/26/2018  Falls in the past year? No   PHQ 2/9 Scores 04/26/2018  PHQ - 2 Score 0    Assessment: Patient continues to benefit from care manager outreach for disease management and support.    Plan:  [facility] CM Care Plan Problem One     Most Recent Value  Care Plan Problem One  Recent Hospitalization: Heart Failure  Role Documenting the Problem One  Care Management Telephonic Coordinator  Care Plan for Problem One  Active  THN Long Term Goal   Patient will not readmit to the hospital within 30 days.  THN Long Term Goal Start Date  04/26/18  Interventions for Problem One Long Term Goal  RN CM reiterated heart failure zones and importance of notifying physician of changes to prevent re-hospitalization.  THN CM Short Term Goal #1   Patient will report weighing daily and recording weights within 30 days.  THN CM Short Term Goal #1 Start Date  04/26/18  Interventions for Short Term Goal #1  RN CM reiterated with patient the importance of weights and notifying physician of weight gain of 3 lbs in a day or 5 lbs in a week.    THN CM Short Term Goal #2   Patient will be able to verbalize signs of heart failure and when to notify physician within 30 days.  THN CM Short Term Goal #2 Start Date  04/26/18  Interventions for Short Term Goal #2  RN CM reviewed with patient signs of heart failure.     RN CM will contact patient in the month of August and patient agrees to next outreach.    Jone Baseman, RN, MSN [place] Management Care Management Coordinator Direct Line [phone] Cell [phone] Toll Free: [phone]  Fax: [phone]

## 2018-05-18 DIAGNOSIS — F1120 Opioid dependence, uncomplicated: Secondary | ICD-10-CM | POA: Diagnosis not present

## 2018-05-18 DIAGNOSIS — G8929 Other chronic pain: Secondary | ICD-10-CM | POA: Diagnosis not present

## 2018-05-18 DIAGNOSIS — G2581 Restless legs syndrome: Secondary | ICD-10-CM | POA: Diagnosis not present

## 2018-05-18 DIAGNOSIS — M5416 Radiculopathy, lumbar region: Secondary | ICD-10-CM | POA: Diagnosis not present

## 2018-05-18 DIAGNOSIS — M130 Polyarthritis, unspecified: Secondary | ICD-10-CM | POA: Diagnosis not present

## 2018-05-18 DIAGNOSIS — M5440 Lumbago with sciatica, unspecified side: Secondary | ICD-10-CM | POA: Diagnosis not present

## 2018-05-18 DIAGNOSIS — Z79899 Other long term (current) drug therapy: Secondary | ICD-10-CM | POA: Diagnosis not present

## 2018-05-18 DIAGNOSIS — M419 Scoliosis, unspecified: Secondary | ICD-10-CM | POA: Diagnosis not present

## 2018-05-20 DIAGNOSIS — Z45010 Encounter for checking and testing of cardiac pacemaker pulse generator [battery]: Secondary | ICD-10-CM | POA: Diagnosis not present

## 2018-05-23 DIAGNOSIS — I5040 Unspecified combined systolic (congestive) and diastolic (congestive) heart failure: Secondary | ICD-10-CM | POA: Diagnosis not present

## 2018-05-25 ENCOUNTER — Ambulatory Visit: Payer: Self-pay

## 2018-05-25 DIAGNOSIS — N183 Chronic kidney disease, stage 3 unspecified: Secondary | ICD-10-CM | POA: Insufficient documentation

## 2018-05-25 DIAGNOSIS — I1310 Hypertensive heart and chronic kidney disease without heart failure, with stage 1 through stage 4 chronic kidney disease, or unspecified chronic kidney disease: Secondary | ICD-10-CM | POA: Diagnosis not present

## 2018-05-25 DIAGNOSIS — I129 Hypertensive chronic kidney disease with stage 1 through stage 4 chronic kidney disease, or unspecified chronic kidney disease: Secondary | ICD-10-CM | POA: Insufficient documentation

## 2018-05-25 DIAGNOSIS — N19 Unspecified kidney failure: Secondary | ICD-10-CM | POA: Diagnosis not present

## 2018-05-25 DIAGNOSIS — N179 Acute kidney failure, unspecified: Secondary | ICD-10-CM | POA: Diagnosis not present

## 2018-05-26 ENCOUNTER — Ambulatory Visit: Payer: Self-pay

## 2018-05-27 ENCOUNTER — Other Ambulatory Visit: Payer: Self-pay

## 2018-05-27 NOTE — Patient Outreach (Signed)
[place] [facility]) Care Management  [place]  05/27/2018   Kristen Harrison 1941-05-30 [id number]  Subjective: Telephone call to patient for follow up.  Patient able to verify HIPAA.  Patient reports she saw the Dr. Neta Ehlers the kidney doctor.  She states that he says her numbers were down and that it is possibly due to her not drinking enough.  She states he doubled her fluid restriction to 3000 ml a day.  She is happy about that.  She states that her last weight was 155 lbs.  She denies swelling and shortness of breath.  Discussed with patient heart failure symptoms and importance of notifying the physician.  She verbalized understanding.  Discussed with patient transfer to health coach for further disease management support.  Patient is in agreement.     Objective:   Encounter Medications:  Outpatient Encounter Medications as of 05/27/2018  Medication Sig  . amiodarone (PACERONE) 200 MG tablet Take 200 mg by mouth daily.  Marland Kitchen CRANBERRY EXTRACT PO Take by mouth daily.  Marland Kitchen diltiazem (CARDIZEM CD) 120 MG 24 hr capsule Take by mouth.  . DOCOSAHEXAENOIC ACID PO Take by mouth.  . docusate sodium (COLACE) 100 MG capsule Take by mouth.  . furosemide (LASIX) 40 MG tablet TAKE 1 TABLET BY MOUTH EVERY DAY  . gabapentin (NEURONTIN) 300 MG capsule Take by mouth.  . Multiple Vitamin (MULTI-VITAMINS) TABS Take by mouth.  Marland Kitchen oxymorphone (OPANA) 5 MG tablet Take by mouth.  . Rivaroxaban (XARELTO) 15 MG TABS tablet Take by mouth.  . sertraline (ZOLOFT) 25 MG tablet Take by mouth.   No facility-administered encounter medications on file as of 05/27/2018.     Functional Status:  In your present state of health, do you have any difficulty performing the following activities: 04/26/2018  Hearing? N  Vision? N  Difficulty concentrating or making decisions? N  Walking or climbing stairs? Y  Dressing or bathing? N  Doing errands, shopping? Y  Preparing Food and eating ? N  Using the  Toilet? N  In the past six months, have you accidently leaked urine? N  Do you have problems with loss of bowel control? N  Managing your Medications? N  Managing your Finances? N  Housekeeping or managing your Housekeeping? N  Some recent data might be hidden    Fall/Depression Screening: Fall Risk  04/26/2018  Falls in the past year? No   PHQ 2/9 Scores 04/26/2018  PHQ - 2 Score 0    Assessment: Patient continues to benefit from care manager outreach for disease management and support.    Plan:  [facility] CM Care Plan Problem One     Most Recent Value  Care Plan Problem One  Recent Hospitalization: Heart Failure  Role Documenting the Problem One  Care Management Telephonic Coordinator  Care Plan for Problem One  Active  THN Long Term Goal   Patient will not readmit to the hospital within 30 days.  THN Long Term Goal Start Date  04/26/18  THN Long Term Goal Met Date  05/27/18  Interventions for Problem One Long Term Goal  No re-hospitalizations  THN CM Short Term Goal #1   Patient will report weighing daily and recording weights within 30 days.  THN CM Short Term Goal #1 Start Date  04/26/18  THN CM Short Term Goal #1 Met Date  05/27/18  THN CM Short Term Goal #2   Patient will be able to verbalize signs of heart failure and when to  notify physician within 30 days.  THN CM Short Term Goal #2 Start Date  04/26/18     RN CM will transfer patient to health coach for disease management of heart failure.    Jone Baseman, RN, MSN [place] Management Care Management Coordinator Direct Line [phone] Cell [phone] Toll Free: [phone]  Fax: [phone]

## 2018-06-07 ENCOUNTER — Other Ambulatory Visit: Payer: Self-pay | Admitting: *Deleted

## 2018-06-07 NOTE — Patient Outreach (Signed)
[place] [facility]) Care Management  06/07/2018  Rylie Limburg [birth year] [id number]   RN Health Coach Introduction Call  Referral Date:  05/27/2018 Referral Source:  [facility] Screening Reason for Referral:  Disease Management Education Insurance:  [employer]   Outreach Attempt:  Outreach attempt #1 to patient for introductory call.  Telephone picked up with no response and hung up.   Plan:  RN Health Coach will send unsuccessful outreach letter to patient.  RN Health Coach will make another outreach attempt to patient within 3-4 business days if no return call back from patient.   [place] [phone] [email or web address]

## 2018-06-09 ENCOUNTER — Other Ambulatory Visit: Payer: Self-pay | Admitting: *Deleted

## 2018-06-09 NOTE — Patient Outreach (Signed)
[place] [facility]) Care Management  06/09/2018  Kristen Harrison Jun 22, 1941 [id number]   RN Health Coach Introduction Call  Referral Date:  05/27/2018 Referral Source:  [facility] Screening Reason for Referral:  Disease Management Education Insurance:  [employer]   Outreach Attempt:  Outreach attempt #2 to patient for introduction call. No answer and unable to leave voicemail message due to voicemail not set up.  Plan:  RN Health Coach will make another outreach attempt to patient within 3-4 business days if no return call back from patient.  [place] [phone] [email or web address]

## 2018-06-15 ENCOUNTER — Other Ambulatory Visit: Payer: Self-pay | Admitting: *Deleted

## 2018-06-15 NOTE — Patient Outreach (Signed)
[place] [facility]) Care Management  06/15/2018  Kristen Harrison Jul 23, 1941 [id number]   [place] Introduction Call  Referral Date:05/27/2018 Referral Source:TOC Screening Reason for Referral:Disease Management Education Insurance:[employer]   Outreach Attempt:  Outreach attempt #3 to patient for introduction call. No answer and unable to leave voicemail message due to voicemail not engaging.  Plan:  RN Health Coach will make another outreach attempt to patient within 10 business days if no return call back from patient.  [place] [phone] Artie [email or web address]

## 2018-06-22 ENCOUNTER — Other Ambulatory Visit: Payer: Self-pay | Admitting: *Deleted

## 2018-06-22 NOTE — Patient Outreach (Signed)
[place] [facility]) Care Management  06/22/2018  Kristen Harrison [birth year] [id number]   [place] Introduction Call  Referral Date:05/27/2018 Referral Source:TOC Screening Reason for Referral:Disease Management Education Insurance:[employer]   Outreach Attempt:  Outreach attempt #4 to patient for introduction call. No answer and unable to leave voicemail message due to voicemail not set up.  Plan:  RN Health Coach will make another outreach attempt to patient for introduction and completion of initial assessment in the month of October.  RN Health Coach will send Unsuccessful Outreach Letter.  [place] [phone] [email or web address]

## 2018-06-23 DIAGNOSIS — I5040 Unspecified combined systolic (congestive) and diastolic (congestive) heart failure: Secondary | ICD-10-CM | POA: Diagnosis not present

## 2018-07-15 DIAGNOSIS — Z23 Encounter for immunization: Secondary | ICD-10-CM | POA: Diagnosis not present

## 2018-07-15 DIAGNOSIS — I5032 Chronic diastolic (congestive) heart failure: Secondary | ICD-10-CM | POA: Diagnosis not present

## 2018-07-21 ENCOUNTER — Other Ambulatory Visit: Payer: Self-pay | Admitting: *Deleted

## 2018-07-21 DIAGNOSIS — Z45018 Encounter for adjustment and management of other part of cardiac pacemaker: Secondary | ICD-10-CM | POA: Diagnosis not present

## 2018-07-21 DIAGNOSIS — I495 Sick sinus syndrome: Secondary | ICD-10-CM | POA: Diagnosis not present

## 2018-07-21 NOTE — Patient Outreach (Signed)
[place] [facility]) Care Management  07/21/2018  Ionia Schey [birth year] [id number]   [place] Introduction Call  Referral Date:05/27/2018 Referral Source:TOC Screening Reason for Referral:Disease Management Education Insurance:[employer]   Outreach Attempt:  Outreach attempt #5 to patient for introduction and initial telephone assessment.  Patient answered and verified HIPAA.  RN Health Coach introduced self and role.  Patient verbally agrees to Disease Management Outreaches.  States she is unable to complete initial telephone assessment at this time and is requesting another telephone call back.   Plan:  RN Health Coach will attempt another telephone outreach in the month of October to complete initial telephone assessment.  [place] [phone] [email or web address]

## 2018-07-22 DIAGNOSIS — N183 Chronic kidney disease, stage 3 (moderate): Secondary | ICD-10-CM | POA: Diagnosis not present

## 2018-07-23 DIAGNOSIS — I5040 Unspecified combined systolic (congestive) and diastolic (congestive) heart failure: Secondary | ICD-10-CM | POA: Diagnosis not present

## 2018-07-25 DIAGNOSIS — I129 Hypertensive chronic kidney disease with stage 1 through stage 4 chronic kidney disease, or unspecified chronic kidney disease: Secondary | ICD-10-CM | POA: Diagnosis not present

## 2018-07-25 DIAGNOSIS — N183 Chronic kidney disease, stage 3 (moderate): Secondary | ICD-10-CM | POA: Diagnosis not present

## 2018-07-25 DIAGNOSIS — I1310 Hypertensive heart and chronic kidney disease without heart failure, with stage 1 through stage 4 chronic kidney disease, or unspecified chronic kidney disease: Secondary | ICD-10-CM | POA: Diagnosis not present

## 2018-07-25 DIAGNOSIS — N179 Acute kidney failure, unspecified: Secondary | ICD-10-CM | POA: Diagnosis not present

## 2018-08-04 ENCOUNTER — Encounter: Payer: Self-pay | Admitting: *Deleted

## 2018-08-04 ENCOUNTER — Other Ambulatory Visit: Payer: Self-pay | Admitting: *Deleted

## 2018-08-04 DIAGNOSIS — M542 Cervicalgia: Secondary | ICD-10-CM | POA: Insufficient documentation

## 2018-08-04 DIAGNOSIS — M419 Scoliosis, unspecified: Secondary | ICD-10-CM | POA: Insufficient documentation

## 2018-08-04 DIAGNOSIS — G894 Chronic pain syndrome: Secondary | ICD-10-CM | POA: Insufficient documentation

## 2018-08-04 DIAGNOSIS — M069 Rheumatoid arthritis, unspecified: Secondary | ICD-10-CM | POA: Insufficient documentation

## 2018-08-04 DIAGNOSIS — M5416 Radiculopathy, lumbar region: Secondary | ICD-10-CM | POA: Insufficient documentation

## 2018-08-04 NOTE — Patient Outreach (Signed)
[place] [facility]) Care Management  [place]  08/04/2018   Kristen Harrison 1941-05-10 [id number]   [place] Introduction Call   Referral Date:  05/27/2018 Referral Source:  [facility] Screening Reason for Referral:  Disease Management Education Insurance:  Health Team Advantage   Outreach Attempt:  Successful telephone outreach to patient for initial telephone assessment. HIPAA verified with patient.  Patient completed initial telephone assessment.  Social:  Patient lives at home alone.  Reports being independent with ADLs and daughter assisting with IADLs cooking, cleaning and paying the bills.  States her daughter is assisting her with applying for food stamps.  Ambulates with a cane and denies any falls in the last year.  Daughter transports her to medical appointments.  DME in the home include:  Blood pressure cuff, shower chair, quad cane, Rolator walker, eyeglasses, grab bar in the shower, scale, and bedside commode.  Conditions:  Per chart review and discussion with patient, PMH include but not limited to:  Sinus tachycardia, chronic diastolic congestive heart failure, sick sinus syndrome with pacemaker placement, chronic fatigue, dilated cardiomyopathy, paroxysmal atrial fibrillation, benign hypertension with chronic kidney disease, arthritis, osteoporosis, and scoliosis.  Patient denies any recent hospitalizations or emergency room visits.  Reports she weighs herself daily. Weight this morning was 155.4 pounds, which is within her normal range. Denies any shortness of breath or extremity swelling.  Does endorse chronic back pain managed with pain medication.  Patient stating her pain medication make her sleepy, so she spends a lot of time in the home.  Does not monitor her blood pressure.  Medications:  Patient reports taking about 5 medications.  States she manages her medications herself with weekly pill box fills.  Does report difficulties affording her  Xarelto ($65 a month).  [facility] Pharmacist referral discussed and patient verbally agrees.   Encounter Medications:  Outpatient Encounter Medications as of 08/04/2018  Medication Sig Note  . amiodarone (PACERONE) 200 MG tablet Take 200 mg by mouth daily.   . calcium carbonate (OS-CAL - DOSED IN MG OF ELEMENTAL CALCIUM) 1250 (500 Ca) MG tablet Take 1 tablet by mouth daily with breakfast.   . CRANBERRY EXTRACT PO Take by mouth daily.   Marland Kitchen diltiazem (CARDIZEM CD) 120 MG 24 hr capsule Take by mouth.   . DOCOSAHEXAENOIC ACID PO Take by mouth.   . docusate sodium (COLACE) 100 MG capsule Take by mouth. 08/04/2018: Reports taking daily  . furosemide (LASIX) 40 MG tablet TAKE 1 TABLET BY MOUTH EVERY DAY   . gabapentin (NEURONTIN) 300 MG capsule Take by mouth. 08/04/2018: Reports taking daily  . Multiple Vitamin (MULTI-VITAMINS) TABS Take by mouth. 08/04/2018: Reports taking daily  . oxymorphone (OPANA) 5 MG tablet Take by mouth. 08/04/2018: Every 8 hours  . Rivaroxaban (XARELTO) 15 MG TABS tablet Take by mouth. 08/04/2018: Reports taking once a day  . sertraline (ZOLOFT) 25 MG tablet Take by mouth. 08/04/2018: Reports taking once a day   No facility-administered encounter medications on file as of 08/04/2018.     Functional Status:  In your present state of health, do you have any difficulty performing the following activities: 08/04/2018 04/26/2018  Hearing? Y N  Vision? N N  Difficulty concentrating or making decisions? Y N  Comment forgetful -  Walking or climbing stairs? Y Y  Dressing or bathing? N N  Doing errands, shopping? [name]  Preparing Food and eating ? Y N  Using the Toilet? N N  In the past  six months, have you accidently leaked urine? N N  Do you have problems with loss of bowel control? N N  Managing your Medications? N N  Managing your Finances? Y N  Housekeeping or managing your Housekeeping? Y N  Some recent data might be hidden    Fall/Depression Screening: Fall Risk   08/04/2018 04/26/2018  Falls in the past year? No No   PHQ 2/9 Scores 08/04/2018 04/26/2018  PHQ - 2 Score 1 0    THN CM Care Plan Problem One     Most Recent Value  Care Plan Problem One  Knowledge deficiet related to congestive heart failure.  Role Documenting the Problem One  [place] for Problem One  Active  [facility] Long Term Goal   Patient will report no hospitalizations in the next 90 days.  THN Long Term Goal Start Date  08/04/18  Interventions for Problem One Long Term Goal  Current care plan reviewed and discussed with patient, reviewed medications and indications and encouraged medication compliance, encouraged to keep and attend medical appointments, encouraged to continue to weigh herself daily, discussed when to call physician based on weight, sending 2020 Calendar booklet to help keep track of appointments and document weights  THN CM Short Term Goal #1   Patient will verbalize 4 symptoms in the Yellow Zone in the next 90 days.  THN CM Short Term Goal #1 Start Date  08/04/18  Interventions for Short Term Goal #1  Confirmed patient has Heart Failure Zones education at her home, encouraged patient to post Zones on refridgerator for frequent review and easy access, reviewed signs and symptoms of heart failure, reviewed each zone and signs and symptoms of each zone,      Advanced Directives:  Denies having advance directive in place and does not wish to create one at this time.   Consent:  [facility] services reviewed and discussed.  Patient verbally agrees to Disease Management telephone outreaches and [place] referral for medication assistance.  Plan: RN Health Coach will send [place] referral for medication assistance for Xarelto. RN Health Coach will send primary MD barriers letter. RN Health Coach will route initial telephone assessment note to primary MD. [place] will send patient [place]. RN Health Coach will send patient 2020 Calendar  Booklet. RN Health Coach will make next telephone outreach to patient in the month of January  Hubert Azure RN [place] [phone] [email or web address]

## 2018-08-08 ENCOUNTER — Other Ambulatory Visit: Payer: Self-pay | Admitting: Pharmacy Technician

## 2018-08-08 ENCOUNTER — Telehealth: Payer: Self-pay | Admitting: Pharmacist

## 2018-08-08 NOTE — Patient Outreach (Signed)
[facility]) Care Management  08/08/2018  Kristen Harrison Aug 16, 1941 [id number]  Received J[employer]J patient assistance referral from [place] for Xarelto.  Prepared patient portion to be mailed. Faxed provider portion to [employer] in the [place] office.  Will followup with patient in 7-10 business days to confirm application has been received.  Kristen Harrison P. [name], [place] Management [phone]

## 2018-08-08 NOTE — Patient Outreach (Signed)
Kristen Harrison) Care Management  [place]   08/08/2018  Kristen Harrison 1940/10/14 [id number]  Reason for referral: medication assistance   Referral source: HTA/Telephonic Nurse Referral medication(s): Xarelto Current insurance: HTA  HPI: Atrial fibrillation, hypertension with CKD stage III, back pain, chronic pain syndrome, COPD, status post pacemaker placement.  Patient was called regarding medication assistance. HIPAA identifiers were obtained. Patient said she cannot afford to purchase Xarelto.   Objective: No Known Allergies  eGFR 46 ml/min  Medications Reviewed Today    Reviewed by Elayne Guerin, [facility] (Pharmacist) on 08/08/18 at 1249  Med List Status: <None>  Medication Order Taking? Sig Documenting Provider Last Dose Status Informant  amiodarone (PACERONE) 200 MG tablet [id number] Yes Take 200 mg by mouth daily. [provider] Taking Active   calcium carbonate (OS-CAL - DOSED IN MG OF ELEMENTAL CALCIUM) 1250 (500 Ca) MG tablet [id number] Yes Take 1 tablet by mouth daily with breakfast. [provider] Taking Active Self  CRANBERRY EXTRACT PO [id number] Yes Take by mouth daily. [provider] Taking Active   diltiazem (CARDIZEM CD) 120 MG 24 hr capsule [id number] Yes Take by mouth. [provider] Taking Active   docusate sodium (COLACE) 100 MG capsule [id number] Yes Take by mouth. [provider] Taking Active            Med Note Dorice Lamas Aug 04, 2018  3:33 PM) Reports taking daily  furosemide (LASIX) 40 MG tablet [id number] Yes TAKE 1 TABLET BY MOUTH EVERY DAY [provider] Taking Active   Gabapentin Enacarbil ER 300 MG TBCR [id number] Yes Take 300 mg by mouth daily. [provider] Taking Active   Multiple Vitamin ([facility]) TABS [id number] Yes Take by mouth. [provider] Taking Active            Med Note Corinna Lines Aug 08, 2018 12:40 PM)     nadolol (CORGARD) 20 MG tablet [id number] Yes Take 1 tablet by mouth daily. [provider] Taking Active   Omega-3 1000 MG CAPS [id number] Yes Take 2 g by mouth daily. [provider] Taking Active   oxymorphone (OPANA) 5 MG tablet [id number] Yes Take by mouth every 8 (eight) hours as needed.  [provider] Taking Active            Med Note Corinna Lines Aug 08, 2018 12:40 PM)    Rivaroxaban (XARELTO) 15 MG TABS tablet [id number] Yes Take by mouth. Ebbie Ridge, MD Taking Active            Med Note Corinna Lines Aug 08, 2018 12:40 PM)    sertraline (ZOLOFT) 25 MG tablet [id number]  Take 25 mg by mouth daily.  [provider]  Active            Med Note Corinna Lines Aug 08, 2018 12:40 PM)            Assessment:  Drugs sorted by system:  Neurologic/Psychologic: Sertraline, Gabapentin  Cardiovascular: Amiodarone, Diltiazem, Furosemide, Nadolol, Omega 3 Fatty Acids, Xarelto  Gastrointestinal: Docusate,   Pain: Oxymorphone,   Vitamins/Minerals/Supplements: Calcium Carbonate, Multiple Vitamin, Cranberry,     Medication Review Findings:  . Medications to use caution in the elderly: oxymorphone, gabapentin--increased risk of CNS depression, fall risk and unintentional overdose.   Medication Assistance Findings:  Extra Help:   []  Already receiving Full  Extra Help  [x]  Already receiving Partial Extra Help-Level 4  []  Eligible based on reported income and assets  []  Not Eligible based on reported income and assets  Patient Assistance Programs: Xarelto  made by [employer] o Income requirement met: []  Yes []  No []  Unknown o Out-of-pocket prescription expenditure met:    []  Yes []  No  [x]  Unknown  []  Not applicable $761 (Reported by HTA)     Plan: I will route patient assistance letter to [place] technician who will coordinate patient assistance program application process for medications listed  above.  [facility] pharmacy technician will assist with obtaining all required documents from both patient and provider(s) and submit application(s) once completed.    Follow up with the patient in 2 weeks.  Route note to Dr. Ola Spurr to let him know about the forms. Requested TROOP document from [place], PharmD, [place] Clinical Pharmacist [phone]

## 2018-08-17 DIAGNOSIS — M5440 Lumbago with sciatica, unspecified side: Secondary | ICD-10-CM | POA: Diagnosis not present

## 2018-08-17 DIAGNOSIS — Z79899 Other long term (current) drug therapy: Secondary | ICD-10-CM | POA: Diagnosis not present

## 2018-08-17 DIAGNOSIS — G2581 Restless legs syndrome: Secondary | ICD-10-CM | POA: Diagnosis not present

## 2018-08-17 DIAGNOSIS — M130 Polyarthritis, unspecified: Secondary | ICD-10-CM | POA: Diagnosis not present

## 2018-08-17 DIAGNOSIS — M419 Scoliosis, unspecified: Secondary | ICD-10-CM | POA: Diagnosis not present

## 2018-08-17 DIAGNOSIS — G8929 Other chronic pain: Secondary | ICD-10-CM | POA: Diagnosis not present

## 2018-08-17 DIAGNOSIS — M5416 Radiculopathy, lumbar region: Secondary | ICD-10-CM | POA: Diagnosis not present

## 2018-08-17 DIAGNOSIS — F1120 Opioid dependence, uncomplicated: Secondary | ICD-10-CM | POA: Diagnosis not present

## 2018-08-19 ENCOUNTER — Other Ambulatory Visit: Payer: Self-pay | Admitting: Pharmacy Technician

## 2018-08-19 NOTE — Patient Outreach (Signed)
[place] [facility]) Care Management  08/19/2018  Kristen Harrison 1940/12/01 [id number]    Unsuccessful outreach attempt to followup on patient assistance application.   Attempted to call patient to inquire if she had recevied her J&J patient assistance application for Xarelto. Patient's voicemail picked up but it said it had not been set up and therefore a message could not be left.  Will followup in 5-7 business days if application not received back.  [name] P. [name], [place] Management [phone]

## 2018-08-22 ENCOUNTER — Ambulatory Visit: Payer: Self-pay | Admitting: Pharmacist

## 2018-08-23 ENCOUNTER — Other Ambulatory Visit: Payer: Self-pay | Admitting: Pharmacist

## 2018-08-23 DIAGNOSIS — I5040 Unspecified combined systolic (congestive) and diastolic (congestive) heart failure: Secondary | ICD-10-CM | POA: Diagnosis not present

## 2018-08-23 NOTE — Patient Outreach (Signed)
[place] [facility]) Care Management  08/23/2018  Kristen Harrison Oct 09, 1941 [id number]   Patient was called to follow up on the patient assistance applications that were mailed to her. HIPAA identifiers were obtained. Patient confirmed she received the applications and said she was still working on them and would mail them back tomorrow.   Plan: Forward note to [employer], CPhT for follow up.   Elayne Guerin, PharmD, [place] Clinical Pharmacist [phone]

## 2018-08-24 ENCOUNTER — Ambulatory Visit: Payer: Self-pay | Admitting: Pharmacist

## 2018-08-29 ENCOUNTER — Other Ambulatory Visit: Payer: Self-pay | Admitting: Pharmacy Technician

## 2018-08-29 NOTE — Patient Outreach (Signed)
[facility]) Care Management  08/29/2018  Hermela Hardt 1941-09-26 [id number]   Successful outreach call placed to patient regarding her medication assistance application for J&J for Xarelto.  Patient confirmed that she has mailed back her applications. She believed she placed them in the mail on Friday November 15.  Will followup with patient in 7-10 business days if application has not been received.  Lakesia Dahle P. Jannah Guardiola, [place] Management [phone]

## 2018-08-30 ENCOUNTER — Other Ambulatory Visit: Payer: Self-pay | Admitting: Pharmacy Technician

## 2018-08-30 NOTE — Patient Outreach (Signed)
[place] [facility]) Care Management  08/30/2018  Taleyah Hillman 1940/11/04 [id number]   Received all necessary documents and signatures from both patient and provider.  Submitted completed application to J&J for Xarelto.  Will followup in 7-10 business days to inquire on the status of the application.  Latravis Grine P. Pearline Yerby, [place] Management [phone]

## 2018-09-05 ENCOUNTER — Other Ambulatory Visit: Payer: Self-pay | Admitting: Pharmacy Technician

## 2018-09-05 NOTE — Patient Outreach (Signed)
[facility]) Care Management  09/05/2018  Kristen Harrison November 07, 1940 [id number]   Care coordination call placed to J&J in regards to patient's medication assistance application.  Spoke to [place] who said she had verified and received all necessary documentation for a complete review of the application.  She suggested calling back in 5-7 business days to see if a determination had been amde.  Will followup with J&J in 7-10 business days to see if the application has been approved or denied.  Levester Waldridge P. Tywan Siever, [place] Management [phone]

## 2018-09-14 ENCOUNTER — Other Ambulatory Visit: Payer: Self-pay | Admitting: Pharmacy Technician

## 2018-09-14 NOTE — Patient Outreach (Signed)
[facility] [place] [facility]) Care Management  09/14/2018  Kristen Harrison Jul 13, 1941 [id number]  Care coordination call placed to J&J to inquire on the status of the patient's medication asssitance application for Xarelto.  Spoke to [place] who said the application was still "In processing." She suggested calling on Monday of next week to find out the status of the application.  Will followup in 3-5 business days with J&J to check on the status of the application.  Kristen Harrison P. [name], [place] Management [phone]

## 2018-09-19 ENCOUNTER — Other Ambulatory Visit: Payer: Self-pay | Admitting: Pharmacy Technician

## 2018-09-19 NOTE — Patient Outreach (Signed)
[name] Care Management  09/19/2018  Kristen Harrison 12-07-1940 [id number]    Care coordination call placed to J&J in regards to patient's application for Xarelto.  Spoke to [place] who said it would be another 24-48 hours for processing to be complete.  Will followup with J&J in 3-5 business days to see if a determination has been made.  Lyric Hoar P. Herbert Marken, [place] Management [phone]

## 2018-09-20 ENCOUNTER — Other Ambulatory Visit: Payer: Self-pay | Admitting: Pharmacy Technician

## 2018-09-20 NOTE — Patient Outreach (Signed)
[place] [facility]) Care Management  09/20/2018  Kristen Harrison 29-Jun-1941 [id number]    Successful outreach call placed to patient in regards to her patient assistance application for Xarelto through J&J. Returned patient's call as she states she is out of her medication.  Spoke to Kristen Harrison, HIPAA identifiers verified. Informed Mrs, Harrison that J&J was still processing her application and that hopefully a determination would be made tomorrow (per phone call to them on 12/9).   Called patient's dr, Dr Ola Spurr, to see if the office had any samples. Left a message with the receptionist for the nurse to call me back. Receptionist stated that [facility] health system normally doesn't have samples but she would have someone return my call. Informed Kristen Harrison of this information. She verbalized understanding.  Will followup with J&J in the am to see if a determination has been made and with the provider's office if the call is not returned.  Mayes Sangiovanni P. [place] Franson, [place] Management [phone]

## 2018-09-21 ENCOUNTER — Other Ambulatory Visit: Payer: Self-pay | Admitting: Pharmacy Technician

## 2018-09-21 NOTE — Patient Outreach (Signed)
[place] [facility]) Care Management  09/21/2018  [name] 06/12/1941 [id number]   ADDENDUM  Care coordination call placed to J&J in regards to patient's medication assistance for Xarelto with J&J.  Spoke to [place] who said patient is APPROVED 09/21/18-10/11/18 for a 30 days supply. The coupon card information is as followsDoreen Salvage [id number] [name] [id number] [name] [id number]   [facility] [place] and spoke to [place]. She ran the card information and received a paid claim for a free 30 days supply of Xarelto.  Called patient, HIPAA identifiers verified. Informed patient that she had been approved and that the medication would [facility]. Informed patient that when she got close to spending 4% of her income (about $500) to give [place] a call so that she can reapply in 2020. Patient verbalized understanding and stated her daughter would be picking up the medication later today. Inquired if patient had any further questions or concerns and she stated no.  Will route note to [place] for case closure for 2019 patient assistance.  Anothony Bursch P. Demontae Antunes, [place] Management [phone]

## 2018-09-21 NOTE — Patient Outreach (Signed)
[place] [facility]) Care Management  09/21/2018  Kristen Harrison [birth year] [id number]   Care coordination call placed to J&J to inquire on the status of the patient's medication assistance application for Xarelto.  Spoke to Kristen Harrison and informed her that Kristen Harrison said the application would be processed on 09/20/2018. Kristen Harrison said the application was still in processing and that it would be finished by end of business today. Stressed to Kristen Harrison that the patient is out of medication which could be detrimental to her health. Kristen Harrison apologized and said I could try back later in the day to see if the application had finished processing.  Will followup later this afternoon with J&J to inquire on status of application.  Livan Hires P. Allante Whitmire, [place] Management [phone]

## 2018-09-22 ENCOUNTER — Telehealth: Payer: Self-pay | Admitting: Pharmacist

## 2018-09-22 DIAGNOSIS — I5040 Unspecified combined systolic (congestive) and diastolic (congestive) heart failure: Secondary | ICD-10-CM | POA: Diagnosis not present

## 2018-09-22 NOTE — Telephone Encounter (Signed)
-----   Message from Jason Fila, CPhT sent at 09/21/2018  3:15 PM EST ----- Kristen Harrison

## 2018-09-22 NOTE — Patient Outreach (Signed)
[name]) Care Management  09/22/2018  Kristen Harrison [birth year] [id number]   Patient's case is being closed as she received a 30 day supply of Xarelto. The patient assistance season is for 2019 is coming to a close. [name] spoke with the patient and confirmed she would pick up her Xarelto prescription at her local pharmacy and will reach out to [place] next year when she gets close to reaching the out-of-pocket expenditure requirement.  Elayne Guerin, PharmD, [place] Clinical Pharmacist [phone]

## 2018-10-23 DIAGNOSIS — I5040 Unspecified combined systolic (congestive) and diastolic (congestive) heart failure: Secondary | ICD-10-CM | POA: Diagnosis not present

## 2018-10-24 ENCOUNTER — Other Ambulatory Visit: Payer: Self-pay | Admitting: *Deleted

## 2018-10-24 NOTE — Patient Outreach (Signed)
[facility]) Care Management  10/24/2018  Meryem Haertel 09-07-1941 [id number]   RN Health Coach Quarterly Outreach  Referral Date:05/27/2018 Referral Source:TOC Screening Reason for Referral:Disease Management Education Insurance:Health Team Advantage   Outreach Attempt:  Outreach attempt #1 to patient for quarterly follow up. No answer and unable to leave voicemail message due to voicemail box not being set up.  Plan:  RN Health Coach will make another outreach attempt within the month of January.   [place] [phone] [email or web address]

## 2018-11-08 ENCOUNTER — Ambulatory Visit: Payer: Self-pay | Admitting: *Deleted

## 2018-11-09 ENCOUNTER — Other Ambulatory Visit: Payer: Self-pay | Admitting: *Deleted

## 2018-11-09 DIAGNOSIS — G8929 Other chronic pain: Secondary | ICD-10-CM | POA: Diagnosis not present

## 2018-11-09 DIAGNOSIS — M5416 Radiculopathy, lumbar region: Secondary | ICD-10-CM | POA: Diagnosis not present

## 2018-11-09 DIAGNOSIS — G2581 Restless legs syndrome: Secondary | ICD-10-CM | POA: Diagnosis not present

## 2018-11-09 DIAGNOSIS — I129 Hypertensive chronic kidney disease with stage 1 through stage 4 chronic kidney disease, or unspecified chronic kidney disease: Secondary | ICD-10-CM | POA: Diagnosis not present

## 2018-11-09 DIAGNOSIS — I5032 Chronic diastolic (congestive) heart failure: Secondary | ICD-10-CM | POA: Diagnosis not present

## 2018-11-09 DIAGNOSIS — M130 Polyarthritis, unspecified: Secondary | ICD-10-CM | POA: Diagnosis not present

## 2018-11-09 DIAGNOSIS — M5440 Lumbago with sciatica, unspecified side: Secondary | ICD-10-CM | POA: Diagnosis not present

## 2018-11-09 DIAGNOSIS — I5033 Acute on chronic diastolic (congestive) heart failure: Secondary | ICD-10-CM | POA: Diagnosis not present

## 2018-11-09 DIAGNOSIS — M419 Scoliosis, unspecified: Secondary | ICD-10-CM | POA: Diagnosis not present

## 2018-11-09 DIAGNOSIS — Z79899 Other long term (current) drug therapy: Secondary | ICD-10-CM | POA: Diagnosis not present

## 2018-11-09 DIAGNOSIS — I495 Sick sinus syndrome: Secondary | ICD-10-CM | POA: Diagnosis not present

## 2018-11-09 DIAGNOSIS — I420 Dilated cardiomyopathy: Secondary | ICD-10-CM | POA: Diagnosis not present

## 2018-11-09 DIAGNOSIS — N183 Chronic kidney disease, stage 3 (moderate): Secondary | ICD-10-CM | POA: Diagnosis not present

## 2018-11-09 DIAGNOSIS — F1120 Opioid dependence, uncomplicated: Secondary | ICD-10-CM | POA: Diagnosis not present

## 2018-11-09 NOTE — Patient Outreach (Signed)
[name]) Care Management  11/09/2018  Kristen Harrison 1940-10-25 [id number]   RN Health Coach Quarterly Outreach  Referral Date:05/27/2018 Referral Source:TOC Screening Reason for Referral:Disease Management Education Insurance:Health Team Advantage   Outreach Attempt:  Outreach attempt #2 to patient for quarterly follow up. No answer. RN Health Coach left HIPAA compliant voicemail message along with contact information.  Plan:  RN Health Coach will make another outreach attempt within the month of February.  [place] [phone] [email or web address]

## 2018-11-10 DIAGNOSIS — R9431 Abnormal electrocardiogram [ECG] [EKG]: Secondary | ICD-10-CM | POA: Diagnosis not present

## 2018-11-16 DIAGNOSIS — N183 Chronic kidney disease, stage 3 (moderate): Secondary | ICD-10-CM | POA: Diagnosis not present

## 2018-11-16 DIAGNOSIS — I4891 Unspecified atrial fibrillation: Secondary | ICD-10-CM | POA: Diagnosis not present

## 2018-11-16 DIAGNOSIS — E7800 Pure hypercholesterolemia, unspecified: Secondary | ICD-10-CM | POA: Diagnosis not present

## 2018-11-16 DIAGNOSIS — I5032 Chronic diastolic (congestive) heart failure: Secondary | ICD-10-CM | POA: Diagnosis not present

## 2018-11-16 DIAGNOSIS — Z1389 Encounter for screening for other disorder: Secondary | ICD-10-CM | POA: Diagnosis not present

## 2018-11-16 DIAGNOSIS — Z0000 Encounter for general adult medical examination without abnormal findings: Secondary | ICD-10-CM | POA: Diagnosis not present

## 2018-11-19 DIAGNOSIS — Z950 Presence of cardiac pacemaker: Secondary | ICD-10-CM | POA: Diagnosis not present

## 2018-11-23 DIAGNOSIS — I5040 Unspecified combined systolic (congestive) and diastolic (congestive) heart failure: Secondary | ICD-10-CM | POA: Diagnosis not present

## 2018-11-28 DIAGNOSIS — I1310 Hypertensive heart and chronic kidney disease without heart failure, with stage 1 through stage 4 chronic kidney disease, or unspecified chronic kidney disease: Secondary | ICD-10-CM | POA: Diagnosis not present

## 2018-11-28 DIAGNOSIS — N183 Chronic kidney disease, stage 3 (moderate): Secondary | ICD-10-CM | POA: Diagnosis not present

## 2018-11-28 DIAGNOSIS — I129 Hypertensive chronic kidney disease with stage 1 through stage 4 chronic kidney disease, or unspecified chronic kidney disease: Secondary | ICD-10-CM | POA: Diagnosis not present

## 2018-12-05 ENCOUNTER — Other Ambulatory Visit: Payer: Self-pay | Admitting: *Deleted

## 2018-12-05 ENCOUNTER — Encounter: Payer: Self-pay | Admitting: *Deleted

## 2018-12-05 NOTE — Patient Outreach (Addendum)
[facility]) Care Management  [place]  12/05/2018   Kristen Harrison 03/03/1941 [id number]   RN Health Coach Quarterly Outreach   Referral Date:  05/27/2018 Referral Source:  [facility] Screening Reason for Referral:  Disease Management Education Insurance:  [employer]   Outreach Attempt:  Successful telephone outreach to patient for quarterly follow up.  HIPAA verified with patient.  Patient stating she is doing better.  Reporting she continues to weigh herself daily.  Weight this morning was 185 pounds (range 180-190).  Denies any shortness of breath and reporting very little trace edema in ankles.  States she can tell her heart has not been out of rhythm recently.  Patient reporting she has not needed to use her home oxygen and is asking how to return to the dispensing company.  Encouraged patient to discuss home oxygen with primary care provider.  Patient stating she would like to keep the portable concentrator but does not think she needs the portable tanks.  Again requested patient speak with primary care provider and DME agency.  Encounter Medications:  Outpatient Encounter Medications as of 12/05/2018  Medication Sig Note  . amiodarone (PACERONE) 200 MG tablet Take 200 mg by mouth daily.   . calcium carbonate (OS-CAL - DOSED IN MG OF ELEMENTAL CALCIUM) 1250 (500 Ca) MG tablet Take 1 tablet by mouth daily with breakfast.   . CRANBERRY EXTRACT PO Take by mouth daily.   Marland Kitchen diltiazem (CARDIZEM CD) 120 MG 24 hr capsule Take by mouth.   . docusate sodium (COLACE) 100 MG capsule Take by mouth. 08/04/2018: Reports taking daily  . furosemide (LASIX) 40 MG tablet TAKE 1 TABLET BY MOUTH EVERY DAY   . Gabapentin Enacarbil ER 300 MG TBCR Take 300 mg by mouth daily.   . Multiple Vitamin (MULTI-VITAMINS) TABS Take by mouth.   . nadolol (CORGARD) 20 MG tablet Take 1 tablet by mouth daily.   . Omega-3 1000 MG CAPS Take 2 g by mouth daily.   Marland Kitchen oxymorphone (OPANA)  5 MG tablet Take by mouth every 8 (eight) hours as needed.    . Rivaroxaban (XARELTO) 15 MG TABS tablet Take by mouth.   . sertraline (ZOLOFT) 25 MG tablet Take 25 mg by mouth daily.     No facility-administered encounter medications on file as of 12/05/2018.     Functional Status:  In your present state of health, do you have any difficulty performing the following activities: 08/04/2018 04/26/2018  Hearing? Y N  Vision? N N  Difficulty concentrating or making decisions? Y N  Comment forgetful -  Walking or climbing stairs? Y Y  Dressing or bathing? N N  Doing errands, shopping? [name]  Preparing Food and eating ? Y N  Using the Toilet? N N  In the past six months, have you accidently leaked urine? N N  Do you have problems with loss of bowel control? N N  Managing your Medications? N N  Managing your Finances? Y N  Housekeeping or managing your Housekeeping? Y N  Some recent data might be hidden    Fall/Depression Screening: Fall Risk  12/05/2018 08/04/2018 04/26/2018  Falls in the past year? 0 No No  Follow up Falls evaluation completed;Falls prevention discussed;Education provided - -   PHQ 2/9 Scores 08/04/2018 04/26/2018  PHQ - 2 Score 1 0   THN CM Care Plan Problem One     Most Recent Value  Care Plan Problem One  Knowledge deficiet related  to congestive heart failure.  Role Documenting the Problem One  [place] for Problem One  Active  [facility] Long Term Goal   Patient will report no hospitalizations or emergency room visits in the next 90 days.  THN Long Term Goal Start Date  08/04/18  THN Long Term Goal Met Date  12/05/18  Interventions for Problem One Long Term Goal  Encouraged patient to continue to weigh herself daily, current care plan and goals reviewed and discussed with patient, encouraged to keep and attend scheduled medical appointments, encouraged patient to contact cardiologist to request refill of lasix prescription if pharmacy will not do so,  reviewed signs and symptoms of heart failure exacerbation and heart failure zones and when to call the physician versus going to the emergency room, encouraged patient to continue to review heart failure zones and action plan  [facility] CM Short Term Goal #1   Patient will state she has spoken with her primary care provider concerning home oxygen in the next 90 days.  THN CM Short Term Goal #1 Start Date  12/05/18  [facility] CM Short Term Goal #1 Met Date  12/05/18  Interventions for Short Term Goal #1  Discussed patient use of oxygen and monitoring of her home oxygen saturations, encouraged patient to discuss need for home oxygen with primary care provider, verified agency providing home oxygen for patient, encouraged patient to call number on equipment to discuss returning oxygen to them after speaking with physician     Appointments:  Attended appointment with primary care provider, Dr. Nona Dell around 11/21/2018 and states follow up appointment should be around May/June 2020, awaiting to schedule.  Plan: RN Health Coach will send primary care provider quarterly update. RN Health Coach will make next telephone outreach to patient within the month of May.  [place] [phone] [email or web address]

## 2018-12-22 DIAGNOSIS — I5040 Unspecified combined systolic (congestive) and diastolic (congestive) heart failure: Secondary | ICD-10-CM | POA: Diagnosis not present

## 2019-01-22 DIAGNOSIS — I5040 Unspecified combined systolic (congestive) and diastolic (congestive) heart failure: Secondary | ICD-10-CM | POA: Diagnosis not present

## 2019-01-29 DIAGNOSIS — Z79899 Other long term (current) drug therapy: Secondary | ICD-10-CM | POA: Diagnosis not present

## 2019-01-29 DIAGNOSIS — F1120 Opioid dependence, uncomplicated: Secondary | ICD-10-CM | POA: Diagnosis not present

## 2019-02-01 DIAGNOSIS — G2581 Restless legs syndrome: Secondary | ICD-10-CM | POA: Diagnosis not present

## 2019-02-01 DIAGNOSIS — M5416 Radiculopathy, lumbar region: Secondary | ICD-10-CM | POA: Diagnosis not present

## 2019-02-01 DIAGNOSIS — M5440 Lumbago with sciatica, unspecified side: Secondary | ICD-10-CM | POA: Diagnosis not present

## 2019-02-01 DIAGNOSIS — F1120 Opioid dependence, uncomplicated: Secondary | ICD-10-CM | POA: Diagnosis not present

## 2019-02-01 DIAGNOSIS — M419 Scoliosis, unspecified: Secondary | ICD-10-CM | POA: Diagnosis not present

## 2019-02-01 DIAGNOSIS — M130 Polyarthritis, unspecified: Secondary | ICD-10-CM | POA: Diagnosis not present

## 2019-02-01 DIAGNOSIS — G8929 Other chronic pain: Secondary | ICD-10-CM | POA: Diagnosis not present

## 2019-02-21 DIAGNOSIS — I5040 Unspecified combined systolic (congestive) and diastolic (congestive) heart failure: Secondary | ICD-10-CM | POA: Diagnosis not present

## 2019-02-22 ENCOUNTER — Encounter: Payer: Self-pay | Admitting: *Deleted

## 2019-02-22 ENCOUNTER — Other Ambulatory Visit: Payer: Self-pay | Admitting: *Deleted

## 2019-02-22 NOTE — Patient Outreach (Signed)
[facility]) Care Management  Kristen Harrison  02/22/2019   Kristen Harrison 1941-07-01 [id number]   [place] Quarterly Outreach   Referral Date:  05/27/2018 Referral Source:  [facility] Screening Reason for Referral:  Disease Management Education Insurance:  [employer]   Outreach Attempt:  Successful telephone outreach to patient for quarterly follow up.  HIPAA verified with patient.  Patient reporting she is doing well.  Has been staying in and practicing social distancing.  Daughter has been supplying with food and medications.  Denies any recent sick days. Continues to weigh daily.  Weight this morning was 155 pounds (normally range 151-155 pounds).  Denies any swelling or shortness of breath.  Denies any recent emergency room visits or hospitalizations.  Encounter Medications:  Outpatient Encounter Medications as of 02/22/2019  Medication Sig Note  . amiodarone (PACERONE) 200 MG tablet Take 200 mg by mouth daily.   . calcium carbonate (OS-CAL - DOSED IN MG OF ELEMENTAL CALCIUM) 1250 (500 Ca) MG tablet Take 1 tablet by mouth daily with breakfast.   . CRANBERRY EXTRACT PO Take by mouth daily.   Marland Kitchen diltiazem (CARDIZEM CD) 120 MG 24 hr capsule Take by mouth.   . docusate sodium (COLACE) 100 MG capsule Take by mouth. 08/04/2018: Reports taking daily  . furosemide (LASIX) 40 MG tablet TAKE 1 TABLET BY MOUTH EVERY DAY   . Gabapentin Enacarbil ER 300 MG TBCR Take 300 mg by mouth daily.   . Multiple Vitamin (MULTI-VITAMINS) TABS Take by mouth.   . nadolol (CORGARD) 20 MG tablet Take 1 tablet by mouth daily.   . Omega-3 1000 MG CAPS Take 2 g by mouth daily.   Marland Kitchen oxymorphone (OPANA) 5 MG tablet Take by mouth every 8 (eight) hours as needed.    . Rivaroxaban (XARELTO) 15 MG TABS tablet Take by mouth.   . sertraline (ZOLOFT) 25 MG tablet Take 25 mg by mouth daily.     No facility-administered encounter medications on file as of 02/22/2019.     Functional  Status:  In your present state of health, do you have any difficulty performing the following activities: 08/04/2018 04/26/2018  Hearing? Y N  Vision? N N  Difficulty concentrating or making decisions? Y N  Comment forgetful -  Walking or climbing stairs? Y Y  Dressing or bathing? N N  Doing errands, shopping? [name]  Preparing Food and eating ? Y N  Using the Toilet? N N  In the past six months, have you accidently leaked urine? N N  Do you have problems with loss of bowel control? N N  Managing your Medications? N N  Managing your Finances? Y N  Housekeeping or managing your Housekeeping? Y N  Some recent data might be hidden    Fall/Depression Screening: Fall Risk  02/22/2019 12/05/2018 08/04/2018  Falls in the past year? 0 0 No  Risk for fall due to : Medication side effect - -  Follow up Falls evaluation completed;Falls prevention discussed;Education provided Falls evaluation completed;Falls prevention discussed;Education provided -   PHQ 2/9 Scores 08/04/2018 04/26/2018  PHQ - 2 Score 1 0   THN CM Care Plan Problem One     Most Recent Value  Care Plan Problem One  Knowledge deficiet related to congestive heart failure.  Role Documenting the Problem One  [place] for Problem One  Active  [facility] Long Term Goal   Patient will attend nephrology appointment in the month of  June.  THN Long Term Goal Start Date  02/22/19  THN Long Term Goal Met Date  02/22/19  Interventions for Problem One Long Term Goal  Discussed patient's missed April Nephrology appointment, informed patient she has an appointment with nephrology on 03/30/2019 in the system, encourged patient to verify time of appointment with nephrology office, encouraged patient to contact primary care for next appointment, reviewed medications and encouraged medication compliance, reviewed signs and symtoms of heart failure and when to call the physician based on weight, encouraged to continue to weigh daily, discussed  and encouraged low salt diet  THN CM Short Term Goal #1   Patient will state she has spoken with her primary care provider concerning home oxygen in the next 90 days.  THN CM Short Term Goal #1 Start Date  02/22/19  Interventions for Short Term Goal #1  Confirmed patient continues to have home oxygen in place, confirmed patient has not been using home oxygen, encouraged patient to discuss home oxygen need with provider prior to calling for company to them to pick up oxygen     Appointments:  Reports last appointment with primary care provider was in February 2020 and thinks she has follow up scheduled for June 2020.  Encouraged to contact office and verify appointment.  Patient reports she missed April appointment with Nephrologist.  Discussed with patient there is a June 18 appointment with nephrologist in system and encouraged her to contact office to verify date and time.  Plan: RN Health Coach will send primary care provider quarterly update. RN Health Coach will make next telephone outreach to patient within the month of August.  Nitika Jackowski RN [facility] [phone] [email or web address]

## 2019-03-24 DIAGNOSIS — I5040 Unspecified combined systolic (congestive) and diastolic (congestive) heart failure: Secondary | ICD-10-CM | POA: Diagnosis not present

## 2019-03-29 DIAGNOSIS — Z79899 Other long term (current) drug therapy: Secondary | ICD-10-CM | POA: Diagnosis not present

## 2019-03-29 DIAGNOSIS — M5440 Lumbago with sciatica, unspecified side: Secondary | ICD-10-CM | POA: Diagnosis not present

## 2019-03-29 DIAGNOSIS — G2581 Restless legs syndrome: Secondary | ICD-10-CM | POA: Diagnosis not present

## 2019-03-29 DIAGNOSIS — M5416 Radiculopathy, lumbar region: Secondary | ICD-10-CM | POA: Diagnosis not present

## 2019-03-29 DIAGNOSIS — M130 Polyarthritis, unspecified: Secondary | ICD-10-CM | POA: Diagnosis not present

## 2019-03-29 DIAGNOSIS — M419 Scoliosis, unspecified: Secondary | ICD-10-CM | POA: Diagnosis not present

## 2019-03-29 DIAGNOSIS — F1120 Opioid dependence, uncomplicated: Secondary | ICD-10-CM | POA: Diagnosis not present

## 2019-03-29 DIAGNOSIS — G8929 Other chronic pain: Secondary | ICD-10-CM | POA: Diagnosis not present

## 2019-04-06 ENCOUNTER — Other Ambulatory Visit: Payer: Self-pay | Admitting: *Deleted

## 2019-04-06 NOTE — Patient Outreach (Signed)
[facility]) Care Management  04/06/2019  Marianna Cid 1941-08-01 [id number]   Case Closure/Transition to [facility] Health/CCI  Referral Date:05/27/2018 Referral Source:TOC Screening Reason for Referral:Disease Management Education Insurance:[employer]   Outreach:  Patient case has been transitioned to [facility] Health/CCI for further Care Management assistance.  Plan: RN Health Coach will close case at this time. RN Health Coach will send primary care provider Care Management Case Closure Letter. RN Health Coach will make patient inactive with [facility] Care Management at this time.  [place] [phone] [email or web address]

## 2019-04-11 DIAGNOSIS — Z950 Presence of cardiac pacemaker: Secondary | ICD-10-CM | POA: Diagnosis not present

## 2019-04-23 DIAGNOSIS — I5040 Unspecified combined systolic (congestive) and diastolic (congestive) heart failure: Secondary | ICD-10-CM | POA: Diagnosis not present

## 2019-05-08 ENCOUNTER — Other Ambulatory Visit: Payer: Self-pay

## 2019-05-08 ENCOUNTER — Encounter: Payer: Self-pay | Admitting: Podiatry

## 2019-05-08 ENCOUNTER — Ambulatory Visit (INDEPENDENT_AMBULATORY_CARE_PROVIDER_SITE_OTHER): Payer: PPO | Admitting: Podiatry

## 2019-05-08 ENCOUNTER — Ambulatory Visit (INDEPENDENT_AMBULATORY_CARE_PROVIDER_SITE_OTHER): Payer: PPO

## 2019-05-08 VITALS — Temp 99.0°F | Resp 16

## 2019-05-08 DIAGNOSIS — R600 Localized edema: Secondary | ICD-10-CM

## 2019-05-08 DIAGNOSIS — M79671 Pain in right foot: Secondary | ICD-10-CM | POA: Diagnosis not present

## 2019-05-08 DIAGNOSIS — S92301A Fracture of unspecified metatarsal bone(s), right foot, initial encounter for closed fracture: Secondary | ICD-10-CM

## 2019-05-14 NOTE — Progress Notes (Signed)
Subjective:  Patient ID: Kristen Harrison, female    DOB: [birth year],  MRN: [id number]  Chief Complaint  Patient presents with  . Foot Pain    Lt dorsal foot pain x 1 wk; 5/10 sharp contsant pain - no inujury Tx: icing and elevation -= w/ swelling -worse with walkin     78 y.o. female presents with the above complaint.  History above confirmed with patient.  States that she kicked a door last week.  Review of Systems: Negative except as noted in the HPI. Denies N/V/F/Ch.  Past Medical History:  Diagnosis Date  . Arthritis   . Atrial fibrillation ([place])   . Back pain, chronic   . Cataract    right eye, left eye cataract removed  . CHF (congestive heart failure) ([place])   . Chronic kidney disease   . COPD (chronic obstructive pulmonary disease) ([place])   . Osteoporosis   . Sick sinus syndrome (HCC)     Current Outpatient Medications:  .  amiodarone (PACERONE) 200 MG tablet, Take 200 mg by mouth daily., Disp: , Rfl: 11 .  calcium carbonate (OS-CAL - DOSED IN MG OF ELEMENTAL CALCIUM) 1250 (500 Ca) MG tablet, Take 1 tablet by mouth daily with breakfast., Disp: , Rfl:  .  CRANBERRY EXTRACT PO, Take by mouth daily., Disp: , Rfl:  .  diltiazem (CARDIZEM CD) 120 MG 24 hr capsule, Take by mouth., Disp: , Rfl:  .  docusate sodium (COLACE) 100 MG capsule, Take by mouth., Disp: , Rfl:  .  furosemide (LASIX) 40 MG tablet, TAKE 1 TABLET BY MOUTH EVERY DAY, Disp: , Rfl:  .  gabapentin (NEURONTIN) 300 MG capsule, TAKE 1 CAPSULE BY MOUTH THREE TIMES A DAY, Disp: , Rfl:  .  Gabapentin Enacarbil ER 300 MG TBCR, Take 300 mg by mouth daily., Disp: , Rfl:  .  Multiple Vitamin (MULTI-VITAMINS) TABS, Take by mouth., Disp: , Rfl:  .  nadolol (CORGARD) 20 MG tablet, Take 1 tablet by mouth daily., Disp: , Rfl:  .  Omega-3 1000 MG CAPS, Take 2 g by mouth daily., Disp: , Rfl:  .  oxymorphone (OPANA) 5 MG tablet, Take by mouth every 8 (eight) hours as needed. , Disp: , Rfl:  .  Rivaroxaban (XARELTO) 15 MG  TABS tablet, Take by mouth., Disp: , Rfl:  .  sertraline (ZOLOFT) 25 MG tablet, Take 25 mg by mouth daily. , Disp: , Rfl:   Social History   Tobacco Use  Smoking Status Never Smoker  Smokeless Tobacco Never Used    No Known Allergies Objective:   Vitals:   05/08/19 1533  Resp: 16  Temp: 99 F (37.2 C)   There is no height or weight on file to calculate BMI. Constitutional Well developed. Well nourished.  Vascular Dorsalis pedis pulses palpable bilaterally. Posterior tibial pulses palpable bilaterally. Capillary refill normal to all digits.  No cyanosis or clubbing noted. Pedal hair growth normal.  Neurologic Normal speech. Oriented to person, place, and time. Epicritic sensation to light touch grossly present bilaterally.  Dermatologic Nails well groomed and normal in appearance. No open wounds. No skin lesions.  Orthopedic:  Pain palpation about the dorsal proximal midfoot about the second third metatarsal areas   Radiographs: Taken and reviewed second metatarsal proximal shaft lucency concerning for fracture Assessment:   1. Closed fracture of shaft of metatarsal bone of right foot, initial encounter   2. Pain of right foot   3. Localized edema    Plan:  Patient was evaluated and treated and all questions answered.  2nd Metatarsal Fracture, Proximal Shaft -X-rays taken and reviewed consistent with above -Would benefit from a trial of nonoperative management for this fracture -We will immobilized in surgical shoe.  Medically necessary -Rest ice compression elevation as needed  No follow-ups on file.

## 2019-05-17 ENCOUNTER — Ambulatory Visit: Payer: PPO | Admitting: *Deleted

## 2019-05-22 ENCOUNTER — Ambulatory Visit (INDEPENDENT_AMBULATORY_CARE_PROVIDER_SITE_OTHER): Payer: PPO | Admitting: Podiatry

## 2019-05-22 ENCOUNTER — Ambulatory Visit (INDEPENDENT_AMBULATORY_CARE_PROVIDER_SITE_OTHER): Payer: PPO

## 2019-05-22 ENCOUNTER — Other Ambulatory Visit: Payer: Self-pay | Admitting: Podiatry

## 2019-05-22 ENCOUNTER — Other Ambulatory Visit: Payer: Self-pay

## 2019-05-22 DIAGNOSIS — S92302D Fracture of unspecified metatarsal bone(s), left foot, subsequent encounter for fracture with routine healing: Secondary | ICD-10-CM

## 2019-05-22 DIAGNOSIS — M79671 Pain in right foot: Secondary | ICD-10-CM

## 2019-05-22 DIAGNOSIS — S92301A Fracture of unspecified metatarsal bone(s), right foot, initial encounter for closed fracture: Secondary | ICD-10-CM

## 2019-05-22 NOTE — Progress Notes (Signed)
Subjective:  Patient ID: Kristen Harrison, female    DOB: 11-07-1940,  MRN: [id number]  Chief Complaint  Patient presents with  . Foot Injury    F/U Lt 2nd toe fx pt. states," feels much better, no pain." - pt denies swelling Tx: rest, icing and elevation   . Foot Injury    Rt lateral foot pain (fell and hurt foot )x yesterday; 9/10 pain -w/ swelling Tx: eelvation, icing and pain pills    78 y.o. female presents with the above complaint.  History above confirmed with patient. Hx as above.  Review of Systems: Negative except as noted in the HPI. Denies N/V/F/Ch.  Past Medical History:  Diagnosis Date  . Arthritis   . Atrial fibrillation ([place])   . Back pain, chronic   . Cataract    right eye, left eye cataract removed  . CHF (congestive heart failure) ([place])   . Chronic kidney disease   . COPD (chronic obstructive pulmonary disease) ([place])   . Osteoporosis   . Sick sinus syndrome (HCC)     Current Outpatient Medications:  .  amiodarone (PACERONE) 200 MG tablet, Take 200 mg by mouth daily., Disp: , Rfl: 11 .  calcium carbonate (OS-CAL - DOSED IN MG OF ELEMENTAL CALCIUM) 1250 (500 Ca) MG tablet, Take 1 tablet by mouth daily with breakfast., Disp: , Rfl:  .  CRANBERRY EXTRACT PO, Take by mouth daily., Disp: , Rfl:  .  diltiazem (CARDIZEM CD) 120 MG 24 hr capsule, Take by mouth., Disp: , Rfl:  .  docusate sodium (COLACE) 100 MG capsule, Take by mouth., Disp: , Rfl:  .  furosemide (LASIX) 40 MG tablet, TAKE 1 TABLET BY MOUTH EVERY DAY, Disp: , Rfl:  .  gabapentin (NEURONTIN) 300 MG capsule, TAKE 1 CAPSULE BY MOUTH THREE TIMES A DAY, Disp: , Rfl:  .  Gabapentin Enacarbil ER 300 MG TBCR, Take 300 mg by mouth daily., Disp: , Rfl:  .  Multiple Vitamin (MULTI-VITAMINS) TABS, Take by mouth., Disp: , Rfl:  .  nadolol (CORGARD) 20 MG tablet, Take 1 tablet by mouth daily., Disp: , Rfl:  .  Omega-3 1000 MG CAPS, Take 2 g by mouth daily., Disp: , Rfl:  .  oxymorphone (OPANA) 5 MG tablet,  Take by mouth every 8 (eight) hours as needed. , Disp: , Rfl:  .  Rivaroxaban (XARELTO) 15 MG TABS tablet, Take by mouth., Disp: , Rfl:  .  sertraline (ZOLOFT) 25 MG tablet, Take 25 mg by mouth daily. , Disp: , Rfl:   Social History   Tobacco Use  Smoking Status Never Smoker  Smokeless Tobacco Never Used    No Known Allergies Objective:   There were no vitals filed for this visit. There is no height or weight on file to calculate BMI. Constitutional Well developed. Well nourished.  Vascular Dorsalis pedis pulses palpable bilaterally. Posterior tibial pulses palpable bilaterally. Capillary refill normal to all digits.  No cyanosis or clubbing noted. Pedal hair growth normal.  Neurologic Normal speech. Oriented to person, place, and time. Epicritic sensation to light touch grossly present bilaterally.  Dermatologic Nails well groomed and normal in appearance. No open wounds. No skin lesions.  Orthopedic:  Pain palpation about the dorsal proximal midfoot about the second third metatarsal areas   Radiographs: Taken and reviewed right 5th metatarsal impacted neck fracture with dorsal angulation.  Post reduction images show improved alignment. Assessment:   1. Closed fracture of shaft of metatarsal bone of right foot, initial  encounter   2. Closed fracture of base of metatarsal bone of left foot with routine healing, subsequent encounter    Plan:  Patient was evaluated and treated and all questions answered.  L 2nd Metatarsal Fracture, Proximal Shaft -Improving no XR today -XR at next visit  R 5th Metatarsal Neck Fracture -XR taken and reviewed. -Discussed with patient performing closed reduction. The 5th ray was anesthetized with 5 ccs of 50/50 lidocaine 1% plain, 0.5% marcaine plain. Manual traction was applied to the 5th toe and the fracture was closed reduced and splinted with a soft cast. Post-reduction films show improved alignment and reduction of impaction.  -Surgical shoe dispensed right foot  Return in about 2 weeks (around 06/05/2019).

## 2019-05-24 DIAGNOSIS — I5040 Unspecified combined systolic (congestive) and diastolic (congestive) heart failure: Secondary | ICD-10-CM | POA: Diagnosis not present

## 2019-05-25 ENCOUNTER — Other Ambulatory Visit: Payer: Self-pay | Admitting: Podiatry

## 2019-05-25 DIAGNOSIS — S92301A Fracture of unspecified metatarsal bone(s), right foot, initial encounter for closed fracture: Secondary | ICD-10-CM

## 2019-05-25 DIAGNOSIS — M79671 Pain in right foot: Secondary | ICD-10-CM

## 2019-05-25 DIAGNOSIS — S92302D Fracture of unspecified metatarsal bone(s), left foot, subsequent encounter for fracture with routine healing: Secondary | ICD-10-CM

## 2019-05-29 ENCOUNTER — Ambulatory Visit (INDEPENDENT_AMBULATORY_CARE_PROVIDER_SITE_OTHER): Payer: PPO

## 2019-05-29 ENCOUNTER — Other Ambulatory Visit: Payer: Self-pay

## 2019-05-29 ENCOUNTER — Ambulatory Visit (INDEPENDENT_AMBULATORY_CARE_PROVIDER_SITE_OTHER): Payer: PPO | Admitting: Podiatry

## 2019-05-29 DIAGNOSIS — S92302D Fracture of unspecified metatarsal bone(s), left foot, subsequent encounter for fracture with routine healing: Secondary | ICD-10-CM

## 2019-05-29 DIAGNOSIS — S92301A Fracture of unspecified metatarsal bone(s), right foot, initial encounter for closed fracture: Secondary | ICD-10-CM

## 2019-06-07 DIAGNOSIS — G8929 Other chronic pain: Secondary | ICD-10-CM | POA: Diagnosis not present

## 2019-06-07 DIAGNOSIS — M5416 Radiculopathy, lumbar region: Secondary | ICD-10-CM | POA: Diagnosis not present

## 2019-06-07 DIAGNOSIS — F1120 Opioid dependence, uncomplicated: Secondary | ICD-10-CM | POA: Diagnosis not present

## 2019-06-07 DIAGNOSIS — M130 Polyarthritis, unspecified: Secondary | ICD-10-CM | POA: Diagnosis not present

## 2019-06-07 DIAGNOSIS — M419 Scoliosis, unspecified: Secondary | ICD-10-CM | POA: Diagnosis not present

## 2019-06-07 DIAGNOSIS — G2581 Restless legs syndrome: Secondary | ICD-10-CM | POA: Diagnosis not present

## 2019-06-07 DIAGNOSIS — M5440 Lumbago with sciatica, unspecified side: Secondary | ICD-10-CM | POA: Diagnosis not present

## 2019-06-20 DIAGNOSIS — Z950 Presence of cardiac pacemaker: Secondary | ICD-10-CM | POA: Diagnosis not present

## 2019-06-24 DIAGNOSIS — I5040 Unspecified combined systolic (congestive) and diastolic (congestive) heart failure: Secondary | ICD-10-CM | POA: Diagnosis not present

## 2019-06-25 NOTE — Progress Notes (Signed)
Subjective:  Patient ID: Kristen Harrison, female    DOB: 24-Dec-1940,  MRN: [id number]  No chief complaint on file.   78 y.o. female presents with the above complaint.  States the left is doing good every once in a while she gets a slight twinge also having only a little bit of pain on the right side states is doing well.  Reports 2 out of 10 pain overall denies swelling has been using surgical shoes.  Review of Systems: Negative except as noted in the HPI. Denies N/V/F/Ch.  Past Medical History:  Diagnosis Date  . Arthritis   . Atrial fibrillation ([place])   . Back pain, chronic   . Cataract    right eye, left eye cataract removed  . CHF (congestive heart failure) ([place])   . Chronic kidney disease   . COPD (chronic obstructive pulmonary disease) ([place])   . Osteoporosis   . Sick sinus syndrome (HCC)     Current Outpatient Medications:  .  amiodarone (PACERONE) 200 MG tablet, Take 200 mg by mouth daily., Disp: , Rfl: 11 .  calcium carbonate (OS-CAL - DOSED IN MG OF ELEMENTAL CALCIUM) 1250 (500 Ca) MG tablet, Take 1 tablet by mouth daily with breakfast., Disp: , Rfl:  .  CRANBERRY EXTRACT PO, Take by mouth daily., Disp: , Rfl:  .  diltiazem (CARDIZEM CD) 120 MG 24 hr capsule, Take by mouth., Disp: , Rfl:  .  docusate sodium (COLACE) 100 MG capsule, Take by mouth., Disp: , Rfl:  .  furosemide (LASIX) 40 MG tablet, TAKE 1 TABLET BY MOUTH EVERY DAY, Disp: , Rfl:  .  gabapentin (NEURONTIN) 300 MG capsule, TAKE 1 CAPSULE BY MOUTH THREE TIMES A DAY, Disp: , Rfl:  .  Gabapentin Enacarbil ER 300 MG TBCR, Take 300 mg by mouth daily., Disp: , Rfl:  .  Multiple Vitamin (MULTI-VITAMINS) TABS, Take by mouth., Disp: , Rfl:  .  nadolol (CORGARD) 20 MG tablet, Take 1 tablet by mouth daily., Disp: , Rfl:  .  Omega-3 1000 MG CAPS, Take 2 g by mouth daily., Disp: , Rfl:  .  oxymorphone (OPANA) 5 MG tablet, Take by mouth every 8 (eight) hours as needed. , Disp: , Rfl:  .  Rivaroxaban (XARELTO) 15 MG  TABS tablet, Take by mouth., Disp: , Rfl:  .  sertraline (ZOLOFT) 25 MG tablet, Take 25 mg by mouth daily. , Disp: , Rfl:   Social History   Tobacco Use  Smoking Status Never Smoker  Smokeless Tobacco Never Used    No Known Allergies Objective:   There were no vitals filed for this visit. There is no height or weight on file to calculate BMI. Constitutional Well developed. Well nourished.  Vascular Dorsalis pedis pulses palpable bilaterally. Posterior tibial pulses palpable bilaterally. Capillary refill normal to all digits.  No cyanosis or clubbing noted. Pedal hair growth normal.  Neurologic Normal speech. Oriented to person, place, and time. Epicritic sensation to light touch grossly present bilaterally.  Dermatologic Nails well groomed and normal in appearance. No open wounds. No skin lesions.  Orthopedic:  Pain palpation about the left 2nd metataral right 5th metatarsal neck area   Radiographs: Taken and reviewed fractures in good alignment with progression of healing. Assessment:   1. Closed fracture of shaft of metatarsal bone of right foot, initial encounter   2. Closed fracture of base of metatarsal bone of left foot with routine healing, subsequent encounter    Plan:  Patient was evaluated and  treated and all questions answered.  L 2nd Metatarsal Fracture, Proximal Shaft, R 5th Metatarsal Neck Fracture -Improving, continue surgical shoes -XR show progression of healing without displacement. -F/u in 1 month for repeat XRs. F/u should pain worsen.  No follow-ups on file.

## 2019-07-03 ENCOUNTER — Other Ambulatory Visit: Payer: Self-pay

## 2019-07-03 ENCOUNTER — Ambulatory Visit (INDEPENDENT_AMBULATORY_CARE_PROVIDER_SITE_OTHER): Payer: Self-pay | Admitting: Podiatry

## 2019-07-03 ENCOUNTER — Other Ambulatory Visit: Payer: Self-pay | Admitting: Podiatry

## 2019-07-03 ENCOUNTER — Ambulatory Visit (INDEPENDENT_AMBULATORY_CARE_PROVIDER_SITE_OTHER): Payer: PPO

## 2019-07-03 DIAGNOSIS — S92301D Fracture of unspecified metatarsal bone(s), right foot, subsequent encounter for fracture with routine healing: Secondary | ICD-10-CM

## 2019-07-03 DIAGNOSIS — S92302D Fracture of unspecified metatarsal bone(s), left foot, subsequent encounter for fracture with routine healing: Secondary | ICD-10-CM

## 2019-07-03 DIAGNOSIS — M79671 Pain in right foot: Secondary | ICD-10-CM

## 2019-07-03 DIAGNOSIS — M79672 Pain in left foot: Secondary | ICD-10-CM

## 2019-07-04 NOTE — Progress Notes (Signed)
Subjective:  Patient ID: Kristen Harrison, female    DOB: Oct 19, 1940,  MRN: [id number]  Chief Complaint  Patient presents with  . Foot Injury    F?U Rt 5th met and Lt 2nd met fractures Pt. states," they've been doing good." -pt denies pain Tx: ace wraps and sx shoes -pt denies swelling     78 y.o. female presents with the above complaint.  History above confirmed with patient.  Review of Systems: Negative except as noted in the HPI. Denies N/V/F/Ch.  Past Medical History:  Diagnosis Date  . Arthritis   . Atrial fibrillation ([place])   . Back pain, chronic   . Cataract    right eye, left eye cataract removed  . CHF (congestive heart failure) ([place])   . Chronic kidney disease   . COPD (chronic obstructive pulmonary disease) ([place])   . Osteoporosis   . Sick sinus syndrome ([facility])     Current Outpatient Medications:  .  amiodarone (PACERONE) 200 MG tablet, Take 200 mg by mouth daily., Disp: , Rfl: 11 .  calcium carbonate (OS-CAL - DOSED IN MG OF ELEMENTAL CALCIUM) 1250 (500 Ca) MG tablet, Take 1 tablet by mouth daily with breakfast., Disp: , Rfl:  .  CRANBERRY EXTRACT PO, Take by mouth daily., Disp: , Rfl:  .  diltiazem (CARDIZEM CD) 120 MG 24 hr capsule, Take by mouth., Disp: , Rfl:  .  docusate sodium (COLACE) 100 MG capsule, Take by mouth., Disp: , Rfl:  .  furosemide (LASIX) 40 MG tablet, TAKE 1 TABLET BY MOUTH EVERY DAY, Disp: , Rfl:  .  gabapentin (NEURONTIN) 300 MG capsule, TAKE 1 CAPSULE BY MOUTH THREE TIMES A DAY, Disp: , Rfl:  .  Gabapentin Enacarbil ER 300 MG TBCR, Take 300 mg by mouth daily., Disp: , Rfl:  .  Multiple Vitamin (MULTI-VITAMINS) TABS, Take by mouth., Disp: , Rfl:  .  nadolol (CORGARD) 20 MG tablet, Take 1 tablet by mouth daily., Disp: , Rfl:  .  Omega-3 1000 MG CAPS, Take 2 g by mouth daily., Disp: , Rfl:  .  oxymorphone (OPANA) 5 MG tablet, Take by mouth every 8 (eight) hours as needed. , Disp: , Rfl:  .  Rivaroxaban (XARELTO) 15 MG TABS tablet, Take by  mouth., Disp: , Rfl:  .  sertraline (ZOLOFT) 25 MG tablet, Take 25 mg by mouth daily. , Disp: , Rfl:   Social History   Tobacco Use  Smoking Status Never Smoker  Smokeless Tobacco Never Used    No Known Allergies Objective:   There were no vitals filed for this visit. There is no height or weight on file to calculate BMI. Constitutional Well developed. Well nourished.  Vascular Dorsalis pedis pulses palpable bilaterally. Posterior tibial pulses palpable bilaterally. Capillary refill normal to all digits.  No cyanosis or clubbing noted. Pedal hair growth normal.  Neurologic Normal speech. Oriented to person, place, and time. Epicritic sensation to light touch grossly present bilaterally.  Dermatologic Nails well groomed and normal in appearance. No open wounds. No skin lesions.  Orthopedic:  Pain palpation about the left 2nd metataral no pain at the right 5th metatarsal neck area   Radiographs: Taken and reviewed right for fracture healing well left foot fracture still with evident fracture line and callus formation Assessment:   No diagnosis found. Plan:  Patient was evaluated and treated and all questions answered.  L 2nd Metatarsal Fracture, Proximal Shaft, R 5th Metatarsal Neck Fracture -Repeat x-rays show almost complete healing of  the right foot with incomplete healing of the left.  We will continue immobilization in surgical shoes.  Follow-up in 2 weeks for repeat x-rays  No follow-ups on file.

## 2019-07-12 ENCOUNTER — Other Ambulatory Visit: Payer: Self-pay | Admitting: Podiatry

## 2019-07-12 DIAGNOSIS — M79671 Pain in right foot: Secondary | ICD-10-CM

## 2019-07-12 DIAGNOSIS — S92301D Fracture of unspecified metatarsal bone(s), right foot, subsequent encounter for fracture with routine healing: Secondary | ICD-10-CM

## 2019-07-12 DIAGNOSIS — M79672 Pain in left foot: Secondary | ICD-10-CM

## 2019-07-12 DIAGNOSIS — S92302D Fracture of unspecified metatarsal bone(s), left foot, subsequent encounter for fracture with routine healing: Secondary | ICD-10-CM

## 2019-07-17 ENCOUNTER — Other Ambulatory Visit: Payer: Self-pay

## 2019-07-17 ENCOUNTER — Ambulatory Visit: Payer: PPO | Admitting: Podiatry

## 2019-07-17 ENCOUNTER — Ambulatory Visit (INDEPENDENT_AMBULATORY_CARE_PROVIDER_SITE_OTHER): Payer: PPO

## 2019-07-17 ENCOUNTER — Other Ambulatory Visit: Payer: Self-pay | Admitting: Podiatry

## 2019-07-17 DIAGNOSIS — M79671 Pain in right foot: Secondary | ICD-10-CM

## 2019-07-17 DIAGNOSIS — M79672 Pain in left foot: Secondary | ICD-10-CM

## 2019-07-17 DIAGNOSIS — S92302D Fracture of unspecified metatarsal bone(s), left foot, subsequent encounter for fracture with routine healing: Secondary | ICD-10-CM

## 2019-07-17 DIAGNOSIS — S92301D Fracture of unspecified metatarsal bone(s), right foot, subsequent encounter for fracture with routine healing: Secondary | ICD-10-CM

## 2019-07-19 ENCOUNTER — Other Ambulatory Visit: Payer: Self-pay | Admitting: Podiatry

## 2019-07-19 DIAGNOSIS — S92302D Fracture of unspecified metatarsal bone(s), left foot, subsequent encounter for fracture with routine healing: Secondary | ICD-10-CM

## 2019-07-19 DIAGNOSIS — S92301D Fracture of unspecified metatarsal bone(s), right foot, subsequent encounter for fracture with routine healing: Secondary | ICD-10-CM

## 2019-07-24 DIAGNOSIS — I5040 Unspecified combined systolic (congestive) and diastolic (congestive) heart failure: Secondary | ICD-10-CM | POA: Diagnosis not present

## 2019-07-31 DIAGNOSIS — Z23 Encounter for immunization: Secondary | ICD-10-CM | POA: Diagnosis not present

## 2019-07-31 DIAGNOSIS — E7800 Pure hypercholesterolemia, unspecified: Secondary | ICD-10-CM | POA: Diagnosis not present

## 2019-08-02 DIAGNOSIS — F1120 Opioid dependence, uncomplicated: Secondary | ICD-10-CM | POA: Diagnosis not present

## 2019-08-02 DIAGNOSIS — M419 Scoliosis, unspecified: Secondary | ICD-10-CM | POA: Diagnosis not present

## 2019-08-02 DIAGNOSIS — M5440 Lumbago with sciatica, unspecified side: Secondary | ICD-10-CM | POA: Diagnosis not present

## 2019-08-02 DIAGNOSIS — G8929 Other chronic pain: Secondary | ICD-10-CM | POA: Diagnosis not present

## 2019-08-02 DIAGNOSIS — M5416 Radiculopathy, lumbar region: Secondary | ICD-10-CM | POA: Diagnosis not present

## 2019-08-02 DIAGNOSIS — G2581 Restless legs syndrome: Secondary | ICD-10-CM | POA: Diagnosis not present

## 2019-08-02 DIAGNOSIS — M130 Polyarthritis, unspecified: Secondary | ICD-10-CM | POA: Diagnosis not present

## 2019-08-03 DIAGNOSIS — I495 Sick sinus syndrome: Secondary | ICD-10-CM | POA: Diagnosis not present

## 2019-08-03 DIAGNOSIS — Z45018 Encounter for adjustment and management of other part of cardiac pacemaker: Secondary | ICD-10-CM | POA: Diagnosis not present

## 2019-08-07 ENCOUNTER — Ambulatory Visit (INDEPENDENT_AMBULATORY_CARE_PROVIDER_SITE_OTHER): Payer: PPO

## 2019-08-07 ENCOUNTER — Ambulatory Visit (INDEPENDENT_AMBULATORY_CARE_PROVIDER_SITE_OTHER): Payer: PPO | Admitting: Podiatry

## 2019-08-07 ENCOUNTER — Other Ambulatory Visit: Payer: Self-pay | Admitting: Podiatry

## 2019-08-07 ENCOUNTER — Other Ambulatory Visit: Payer: Self-pay

## 2019-08-07 DIAGNOSIS — S92301D Fracture of unspecified metatarsal bone(s), right foot, subsequent encounter for fracture with routine healing: Secondary | ICD-10-CM

## 2019-08-07 DIAGNOSIS — S92352D Displaced fracture of fifth metatarsal bone, left foot, subsequent encounter for fracture with routine healing: Secondary | ICD-10-CM

## 2019-08-07 DIAGNOSIS — M79671 Pain in right foot: Secondary | ICD-10-CM

## 2019-08-07 DIAGNOSIS — S92302D Fracture of unspecified metatarsal bone(s), left foot, subsequent encounter for fracture with routine healing: Secondary | ICD-10-CM

## 2019-08-07 NOTE — Progress Notes (Signed)
Subjective:  Patient ID: Kristen Harrison, female    DOB: 1941-05-04,  MRN: [id number]  Chief Complaint  Patient presents with  . Foot Injury    F?U Rt 5th and Lt 2nd toe fractures Pt. states," Rt is fine but still feel pain in the Lt foot; 3/10 shpar intermittnet pains -pt dneies redness or swelling Tx: ace wrap in Lt and IBU     78 y.o. female presents with the above complaint.  History above confirmed with patient.  Review of Systems: Negative except as noted in the HPI. Denies N/V/F/Ch.  Past Medical History:  Diagnosis Date  . Arthritis   . Atrial fibrillation ([place])   . Back pain, chronic   . Cataract    right eye, left eye cataract removed  . CHF (congestive heart failure) ([place])   . Chronic kidney disease   . COPD (chronic obstructive pulmonary disease) ([place])   . Osteoporosis   . Sick sinus syndrome ([facility])     Current Outpatient Medications:  .  amiodarone (PACERONE) 200 MG tablet, Take 200 mg by mouth daily., Disp: , Rfl: 11 .  calcium carbonate (OS-CAL - DOSED IN MG OF ELEMENTAL CALCIUM) 1250 (500 Ca) MG tablet, Take 1 tablet by mouth daily with breakfast., Disp: , Rfl:  .  CRANBERRY EXTRACT PO, Take by mouth daily., Disp: , Rfl:  .  diltiazem (CARDIZEM CD) 120 MG 24 hr capsule, Take by mouth., Disp: , Rfl:  .  docusate sodium (COLACE) 100 MG capsule, Take by mouth., Disp: , Rfl:  .  furosemide (LASIX) 40 MG tablet, TAKE 1 TABLET BY MOUTH EVERY DAY, Disp: , Rfl:  .  gabapentin (NEURONTIN) 300 MG capsule, TAKE 1 CAPSULE BY MOUTH THREE TIMES A DAY, Disp: , Rfl:  .  Gabapentin Enacarbil ER 300 MG TBCR, Take 300 mg by mouth daily., Disp: , Rfl:  .  Multiple Vitamin (MULTI-VITAMINS) TABS, Take by mouth., Disp: , Rfl:  .  nadolol (CORGARD) 20 MG tablet, Take 1 tablet by mouth daily., Disp: , Rfl:  .  nystatin (MYCOSTATIN/NYSTOP) powder, APPLY TO AFFECTED AREA 3 TIMES A DAY, Disp: , Rfl:  .  Omega-3 1000 MG CAPS, Take 2 g by mouth daily., Disp: , Rfl:  .  oxymorphone  (OPANA) 5 MG tablet, Take by mouth every 8 (eight) hours as needed. , Disp: , Rfl:  .  Rivaroxaban (XARELTO) 15 MG TABS tablet, Take by mouth., Disp: , Rfl:  .  sertraline (ZOLOFT) 25 MG tablet, Take 25 mg by mouth daily. , Disp: , Rfl:   Social History   Tobacco Use  Smoking Status Never Smoker  Smokeless Tobacco Never Used    No Known Allergies Objective:   There were no vitals filed for this visit. There is no height or weight on file to calculate BMI. Constitutional Well developed. Well nourished.  Vascular Dorsalis pedis pulses palpable bilaterally. Posterior tibial pulses palpable bilaterally. Capillary refill normal to all digits.  No cyanosis or clubbing noted. Pedal hair growth normal.  Neurologic Normal speech. Oriented to person, place, and time. Epicritic sensation to light touch grossly present bilaterally.  Dermatologic Nails well groomed and normal in appearance. No open wounds. No skin lesions.  Orthopedic:  Pain palpation about the left 2nd metataral no pain at the right 5th metatarsal neck area   Radiographs: Taken and reviewed right fracture healed left still evident stress fracture Assessment:   1. Closed fracture of shaft of metatarsal bone of right foot with routine healing,  subsequent encounter   2. Closed fracture of base of metatarsal bone of left foot with routine healing, subsequent encounter    Plan:  Patient was evaluated and treated and all questions answered.  L 2nd Metatarsal Fracture, Proximal Shaft, R 5th Metatarsal Neck Fracture -Transition out of surgical shoe right, left still healing continue for 1 month. -F/u in 1 month for repeat XRs.  No follow-ups on file.

## 2019-08-12 NOTE — Progress Notes (Signed)
Subjective:  Patient ID: Kristen Harrison, female    DOB: 1941/05/20,  MRN: [id number]  Chief Complaint  Patient presents with  . Fracture    F/U Lt 2nd and Rt 5th fx Pt. states," with the Rt one I don't have any probmels with it at all. The Lt on still hurts; 6/10.' Tx: sx shoe and ace wraps     78 y.o. female presents with the above complaint.  History above confirmed with patient.  Review of Systems: Negative except as noted in the HPI. Denies N/V/F/Ch.  Past Medical History:  Diagnosis Date  . Arthritis   . Atrial fibrillation ([place])   . Back pain, chronic   . Cataract    right eye, left eye cataract removed  . CHF (congestive heart failure) ([place])   . Chronic kidney disease   . COPD (chronic obstructive pulmonary disease) ([place])   . Osteoporosis   . Sick sinus syndrome ([facility])     Current Outpatient Medications:  .  amiodarone (PACERONE) 200 MG tablet, Take 200 mg by mouth daily., Disp: , Rfl: 11 .  calcium carbonate (OS-CAL - DOSED IN MG OF ELEMENTAL CALCIUM) 1250 (500 Ca) MG tablet, Take 1 tablet by mouth daily with breakfast., Disp: , Rfl:  .  CRANBERRY EXTRACT PO, Take by mouth daily., Disp: , Rfl:  .  diltiazem (CARDIZEM CD) 120 MG 24 hr capsule, Take by mouth., Disp: , Rfl:  .  docusate sodium (COLACE) 100 MG capsule, Take by mouth., Disp: , Rfl:  .  furosemide (LASIX) 40 MG tablet, TAKE 1 TABLET BY MOUTH EVERY DAY, Disp: , Rfl:  .  gabapentin (NEURONTIN) 300 MG capsule, TAKE 1 CAPSULE BY MOUTH THREE TIMES A DAY, Disp: , Rfl:  .  Gabapentin Enacarbil ER 300 MG TBCR, Take 300 mg by mouth daily., Disp: , Rfl:  .  Multiple Vitamin (MULTI-VITAMINS) TABS, Take by mouth., Disp: , Rfl:  .  nadolol (CORGARD) 20 MG tablet, Take 1 tablet by mouth daily., Disp: , Rfl:  .  nystatin (MYCOSTATIN/NYSTOP) powder, APPLY TO AFFECTED AREA 3 TIMES A DAY, Disp: , Rfl:  .  Omega-3 1000 MG CAPS, Take 2 g by mouth daily., Disp: , Rfl:  .  oxymorphone (OPANA) 5 MG tablet, Take by mouth every  8 (eight) hours as needed. , Disp: , Rfl:  .  Rivaroxaban (XARELTO) 15 MG TABS tablet, Take by mouth., Disp: , Rfl:  .  sertraline (ZOLOFT) 25 MG tablet, Take 25 mg by mouth daily. , Disp: , Rfl:   Social History   Tobacco Use  Smoking Status Never Smoker  Smokeless Tobacco Never Used    No Known Allergies Objective:   There were no vitals filed for this visit. There is no height or weight on file to calculate BMI. Constitutional Well developed. Well nourished.  Vascular Dorsalis pedis pulses palpable bilaterally. Posterior tibial pulses palpable bilaterally. Capillary refill normal to all digits.  No cyanosis or clubbing noted. Pedal hair growth normal.  Neurologic Normal speech. Oriented to person, place, and time. Epicritic sensation to light touch grossly present bilaterally.  Dermatologic Nails well groomed and normal in appearance. No open wounds. No skin lesions.  Orthopedic:  Pain palpation about the left 2nd metataral no pain at the right 5th metatarsal neck area   Radiographs: Incomplete consolidation on the left Assessment:   1. Closed fracture of shaft of metatarsal bone of right foot with routine healing, subsequent encounter   2. Closed fracture of base of  metatarsal bone of left foot with routine healing, subsequent encounter    Plan:  Patient was evaluated and treated and all questions answered.  L 2nd Metatarsal Fracture, Proximal Shaft, R 5th Metatarsal Neck Fracture -Still incomplete healing on the left.  Continue immobilization for 1 more month  No follow-ups on file.

## 2019-09-11 ENCOUNTER — Ambulatory Visit (INDEPENDENT_AMBULATORY_CARE_PROVIDER_SITE_OTHER): Payer: PPO | Admitting: Podiatry

## 2019-09-11 DIAGNOSIS — Z5329 Procedure and treatment not carried out because of patient's decision for other reasons: Secondary | ICD-10-CM

## 2019-09-11 NOTE — Progress Notes (Signed)
Same day cancellation

## 2019-09-27 DIAGNOSIS — G8929 Other chronic pain: Secondary | ICD-10-CM | POA: Diagnosis not present

## 2019-09-27 DIAGNOSIS — F1120 Opioid dependence, uncomplicated: Secondary | ICD-10-CM | POA: Diagnosis not present

## 2019-09-27 DIAGNOSIS — M5440 Lumbago with sciatica, unspecified side: Secondary | ICD-10-CM | POA: Diagnosis not present

## 2019-09-27 DIAGNOSIS — G2581 Restless legs syndrome: Secondary | ICD-10-CM | POA: Diagnosis not present

## 2019-09-27 DIAGNOSIS — M5416 Radiculopathy, lumbar region: Secondary | ICD-10-CM | POA: Diagnosis not present

## 2019-09-27 DIAGNOSIS — M419 Scoliosis, unspecified: Secondary | ICD-10-CM | POA: Diagnosis not present

## 2019-09-27 DIAGNOSIS — M130 Polyarthritis, unspecified: Secondary | ICD-10-CM | POA: Diagnosis not present

## 2019-11-02 DIAGNOSIS — Z950 Presence of cardiac pacemaker: Secondary | ICD-10-CM | POA: Diagnosis not present

## 2019-11-24 DIAGNOSIS — I5040 Unspecified combined systolic (congestive) and diastolic (congestive) heart failure: Secondary | ICD-10-CM | POA: Diagnosis not present

## 2019-12-06 DIAGNOSIS — Z79899 Other long term (current) drug therapy: Secondary | ICD-10-CM | POA: Diagnosis not present

## 2019-12-06 DIAGNOSIS — M5416 Radiculopathy, lumbar region: Secondary | ICD-10-CM | POA: Diagnosis not present

## 2019-12-06 DIAGNOSIS — G894 Chronic pain syndrome: Secondary | ICD-10-CM | POA: Diagnosis not present

## 2019-12-06 DIAGNOSIS — F1120 Opioid dependence, uncomplicated: Secondary | ICD-10-CM | POA: Diagnosis not present

## 2019-12-06 DIAGNOSIS — M419 Scoliosis, unspecified: Secondary | ICD-10-CM | POA: Diagnosis not present

## 2019-12-06 DIAGNOSIS — M130 Polyarthritis, unspecified: Secondary | ICD-10-CM | POA: Diagnosis not present

## 2019-12-06 DIAGNOSIS — G2581 Restless legs syndrome: Secondary | ICD-10-CM | POA: Diagnosis not present

## 2019-12-06 DIAGNOSIS — M5440 Lumbago with sciatica, unspecified side: Secondary | ICD-10-CM | POA: Diagnosis not present

## 2019-12-22 DIAGNOSIS — I5040 Unspecified combined systolic (congestive) and diastolic (congestive) heart failure: Secondary | ICD-10-CM | POA: Diagnosis not present

## 2020-01-19 DIAGNOSIS — I5040 Unspecified combined systolic (congestive) and diastolic (congestive) heart failure: Secondary | ICD-10-CM | POA: Diagnosis not present

## 2020-01-31 DIAGNOSIS — M419 Scoliosis, unspecified: Secondary | ICD-10-CM | POA: Diagnosis not present

## 2020-01-31 DIAGNOSIS — G894 Chronic pain syndrome: Secondary | ICD-10-CM | POA: Diagnosis not present

## 2020-01-31 DIAGNOSIS — F1120 Opioid dependence, uncomplicated: Secondary | ICD-10-CM | POA: Diagnosis not present

## 2020-01-31 DIAGNOSIS — M5440 Lumbago with sciatica, unspecified side: Secondary | ICD-10-CM | POA: Diagnosis not present

## 2020-01-31 DIAGNOSIS — G2581 Restless legs syndrome: Secondary | ICD-10-CM | POA: Diagnosis not present

## 2020-01-31 DIAGNOSIS — M5416 Radiculopathy, lumbar region: Secondary | ICD-10-CM | POA: Diagnosis not present

## 2020-01-31 DIAGNOSIS — Z79899 Other long term (current) drug therapy: Secondary | ICD-10-CM | POA: Diagnosis not present

## 2020-01-31 DIAGNOSIS — M130 Polyarthritis, unspecified: Secondary | ICD-10-CM | POA: Diagnosis not present

## 2020-02-09 DIAGNOSIS — R0602 Shortness of breath: Secondary | ICD-10-CM | POA: Diagnosis not present

## 2020-02-09 DIAGNOSIS — I480 Paroxysmal atrial fibrillation: Secondary | ICD-10-CM | POA: Diagnosis not present

## 2020-02-09 DIAGNOSIS — N183 Chronic kidney disease, stage 3 unspecified: Secondary | ICD-10-CM | POA: Diagnosis not present

## 2020-02-09 DIAGNOSIS — I495 Sick sinus syndrome: Secondary | ICD-10-CM | POA: Diagnosis not present

## 2020-02-09 DIAGNOSIS — I129 Hypertensive chronic kidney disease with stage 1 through stage 4 chronic kidney disease, or unspecified chronic kidney disease: Secondary | ICD-10-CM | POA: Diagnosis not present

## 2020-02-09 DIAGNOSIS — Z45018 Encounter for adjustment and management of other part of cardiac pacemaker: Secondary | ICD-10-CM | POA: Diagnosis not present

## 2020-02-09 DIAGNOSIS — Z7901 Long term (current) use of anticoagulants: Secondary | ICD-10-CM | POA: Diagnosis not present

## 2020-02-09 DIAGNOSIS — I420 Dilated cardiomyopathy: Secondary | ICD-10-CM | POA: Diagnosis not present

## 2020-02-09 DIAGNOSIS — I5032 Chronic diastolic (congestive) heart failure: Secondary | ICD-10-CM | POA: Diagnosis not present

## 2020-02-14 DIAGNOSIS — I420 Dilated cardiomyopathy: Secondary | ICD-10-CM | POA: Diagnosis not present

## 2020-02-14 DIAGNOSIS — I480 Paroxysmal atrial fibrillation: Secondary | ICD-10-CM | POA: Diagnosis not present

## 2020-02-21 DIAGNOSIS — I5040 Unspecified combined systolic (congestive) and diastolic (congestive) heart failure: Secondary | ICD-10-CM | POA: Diagnosis not present

## 2020-03-23 DIAGNOSIS — I5040 Unspecified combined systolic (congestive) and diastolic (congestive) heart failure: Secondary | ICD-10-CM | POA: Diagnosis not present

## 2020-04-03 DIAGNOSIS — M5440 Lumbago with sciatica, unspecified side: Secondary | ICD-10-CM | POA: Diagnosis not present

## 2020-04-03 DIAGNOSIS — G894 Chronic pain syndrome: Secondary | ICD-10-CM | POA: Diagnosis not present

## 2020-04-03 DIAGNOSIS — M419 Scoliosis, unspecified: Secondary | ICD-10-CM | POA: Diagnosis not present

## 2020-04-03 DIAGNOSIS — Z79899 Other long term (current) drug therapy: Secondary | ICD-10-CM | POA: Diagnosis not present

## 2020-04-03 DIAGNOSIS — F1120 Opioid dependence, uncomplicated: Secondary | ICD-10-CM | POA: Diagnosis not present

## 2020-04-03 DIAGNOSIS — M5416 Radiculopathy, lumbar region: Secondary | ICD-10-CM | POA: Diagnosis not present

## 2020-04-03 DIAGNOSIS — G2581 Restless legs syndrome: Secondary | ICD-10-CM | POA: Diagnosis not present

## 2020-04-03 DIAGNOSIS — M130 Polyarthritis, unspecified: Secondary | ICD-10-CM | POA: Diagnosis not present

## 2020-04-22 DIAGNOSIS — I5040 Unspecified combined systolic (congestive) and diastolic (congestive) heart failure: Secondary | ICD-10-CM | POA: Diagnosis not present

## 2020-05-12 DIAGNOSIS — F1120 Opioid dependence, uncomplicated: Secondary | ICD-10-CM | POA: Diagnosis not present

## 2020-05-12 DIAGNOSIS — Z79899 Other long term (current) drug therapy: Secondary | ICD-10-CM | POA: Diagnosis not present

## 2020-05-23 DIAGNOSIS — I5040 Unspecified combined systolic (congestive) and diastolic (congestive) heart failure: Secondary | ICD-10-CM | POA: Diagnosis not present

## 2020-05-29 DIAGNOSIS — G2581 Restless legs syndrome: Secondary | ICD-10-CM | POA: Diagnosis not present

## 2020-05-29 DIAGNOSIS — M130 Polyarthritis, unspecified: Secondary | ICD-10-CM | POA: Diagnosis not present

## 2020-05-29 DIAGNOSIS — F1120 Opioid dependence, uncomplicated: Secondary | ICD-10-CM | POA: Diagnosis not present

## 2020-05-29 DIAGNOSIS — M5416 Radiculopathy, lumbar region: Secondary | ICD-10-CM | POA: Diagnosis not present

## 2020-05-29 DIAGNOSIS — M419 Scoliosis, unspecified: Secondary | ICD-10-CM | POA: Diagnosis not present

## 2020-05-29 DIAGNOSIS — M5440 Lumbago with sciatica, unspecified side: Secondary | ICD-10-CM | POA: Diagnosis not present

## 2020-05-29 DIAGNOSIS — G894 Chronic pain syndrome: Secondary | ICD-10-CM | POA: Diagnosis not present

## 2020-06-23 DIAGNOSIS — I5040 Unspecified combined systolic (congestive) and diastolic (congestive) heart failure: Secondary | ICD-10-CM | POA: Diagnosis not present

## 2020-07-11 DIAGNOSIS — Z79891 Long term (current) use of opiate analgesic: Secondary | ICD-10-CM | POA: Diagnosis not present

## 2020-07-11 DIAGNOSIS — F1120 Opioid dependence, uncomplicated: Secondary | ICD-10-CM | POA: Diagnosis not present

## 2020-07-23 DIAGNOSIS — I5040 Unspecified combined systolic (congestive) and diastolic (congestive) heart failure: Secondary | ICD-10-CM | POA: Diagnosis not present

## 2020-07-24 DIAGNOSIS — G2581 Restless legs syndrome: Secondary | ICD-10-CM | POA: Diagnosis not present

## 2020-07-24 DIAGNOSIS — M5440 Lumbago with sciatica, unspecified side: Secondary | ICD-10-CM | POA: Diagnosis not present

## 2020-07-24 DIAGNOSIS — M130 Polyarthritis, unspecified: Secondary | ICD-10-CM | POA: Diagnosis not present

## 2020-07-24 DIAGNOSIS — M5416 Radiculopathy, lumbar region: Secondary | ICD-10-CM | POA: Diagnosis not present

## 2020-07-24 DIAGNOSIS — M419 Scoliosis, unspecified: Secondary | ICD-10-CM | POA: Diagnosis not present

## 2020-07-24 DIAGNOSIS — G894 Chronic pain syndrome: Secondary | ICD-10-CM | POA: Diagnosis not present

## 2020-07-24 DIAGNOSIS — F1120 Opioid dependence, uncomplicated: Secondary | ICD-10-CM | POA: Diagnosis not present

## 2020-08-08 DIAGNOSIS — Z7901 Long term (current) use of anticoagulants: Secondary | ICD-10-CM | POA: Diagnosis not present

## 2020-08-08 DIAGNOSIS — R12 Heartburn: Secondary | ICD-10-CM | POA: Diagnosis not present

## 2020-08-08 DIAGNOSIS — Z45018 Encounter for adjustment and management of other part of cardiac pacemaker: Secondary | ICD-10-CM | POA: Diagnosis not present

## 2020-08-23 DIAGNOSIS — I5040 Unspecified combined systolic (congestive) and diastolic (congestive) heart failure: Secondary | ICD-10-CM | POA: Diagnosis not present

## 2020-09-22 DIAGNOSIS — I5040 Unspecified combined systolic (congestive) and diastolic (congestive) heart failure: Secondary | ICD-10-CM | POA: Diagnosis not present

## 2020-09-25 DIAGNOSIS — M5416 Radiculopathy, lumbar region: Secondary | ICD-10-CM | POA: Diagnosis not present

## 2020-09-25 DIAGNOSIS — M130 Polyarthritis, unspecified: Secondary | ICD-10-CM | POA: Diagnosis not present

## 2020-09-25 DIAGNOSIS — G894 Chronic pain syndrome: Secondary | ICD-10-CM | POA: Diagnosis not present

## 2020-09-25 DIAGNOSIS — F1120 Opioid dependence, uncomplicated: Secondary | ICD-10-CM | POA: Diagnosis not present

## 2020-09-25 DIAGNOSIS — M5440 Lumbago with sciatica, unspecified side: Secondary | ICD-10-CM | POA: Diagnosis not present

## 2020-09-25 DIAGNOSIS — M419 Scoliosis, unspecified: Secondary | ICD-10-CM | POA: Diagnosis not present

## 2020-09-25 DIAGNOSIS — G2581 Restless legs syndrome: Secondary | ICD-10-CM | POA: Diagnosis not present

## 2020-10-09 DIAGNOSIS — I4891 Unspecified atrial fibrillation: Secondary | ICD-10-CM | POA: Diagnosis not present

## 2020-10-09 DIAGNOSIS — I272 Pulmonary hypertension, unspecified: Secondary | ICD-10-CM | POA: Diagnosis not present

## 2020-10-09 DIAGNOSIS — F32A Depression, unspecified: Secondary | ICD-10-CM | POA: Diagnosis not present

## 2020-10-09 DIAGNOSIS — Z79891 Long term (current) use of opiate analgesic: Secondary | ICD-10-CM | POA: Diagnosis not present

## 2020-10-09 DIAGNOSIS — N184 Chronic kidney disease, stage 4 (severe): Secondary | ICD-10-CM | POA: Diagnosis not present

## 2020-10-09 DIAGNOSIS — Z950 Presence of cardiac pacemaker: Secondary | ICD-10-CM | POA: Diagnosis not present

## 2020-10-09 DIAGNOSIS — R0902 Hypoxemia: Secondary | ICD-10-CM | POA: Diagnosis not present

## 2020-10-09 DIAGNOSIS — M810 Age-related osteoporosis without current pathological fracture: Secondary | ICD-10-CM | POA: Diagnosis not present

## 2020-10-09 DIAGNOSIS — I5033 Acute on chronic diastolic (congestive) heart failure: Secondary | ICD-10-CM | POA: Diagnosis not present

## 2020-10-09 DIAGNOSIS — I5030 Unspecified diastolic (congestive) heart failure: Secondary | ICD-10-CM | POA: Diagnosis not present

## 2020-10-09 DIAGNOSIS — J9601 Acute respiratory failure with hypoxia: Secondary | ICD-10-CM | POA: Diagnosis not present

## 2020-10-09 DIAGNOSIS — Z95810 Presence of automatic (implantable) cardiac defibrillator: Secondary | ICD-10-CM | POA: Diagnosis not present

## 2020-10-09 DIAGNOSIS — R112 Nausea with vomiting, unspecified: Secondary | ICD-10-CM | POA: Diagnosis not present

## 2020-10-09 DIAGNOSIS — I130 Hypertensive heart and chronic kidney disease with heart failure and stage 1 through stage 4 chronic kidney disease, or unspecified chronic kidney disease: Secondary | ICD-10-CM | POA: Diagnosis not present

## 2020-10-09 DIAGNOSIS — N189 Chronic kidney disease, unspecified: Secondary | ICD-10-CM | POA: Diagnosis not present

## 2020-10-09 DIAGNOSIS — I5040 Unspecified combined systolic (congestive) and diastolic (congestive) heart failure: Secondary | ICD-10-CM | POA: Diagnosis not present

## 2020-10-09 DIAGNOSIS — R7401 Elevation of levels of liver transaminase levels: Secondary | ICD-10-CM | POA: Diagnosis not present

## 2020-10-09 DIAGNOSIS — I495 Sick sinus syndrome: Secondary | ICD-10-CM | POA: Diagnosis not present

## 2020-10-09 DIAGNOSIS — Z20822 Contact with and (suspected) exposure to covid-19: Secondary | ICD-10-CM | POA: Diagnosis not present

## 2020-10-09 DIAGNOSIS — R069 Unspecified abnormalities of breathing: Secondary | ICD-10-CM | POA: Diagnosis not present

## 2020-10-09 DIAGNOSIS — R109 Unspecified abdominal pain: Secondary | ICD-10-CM | POA: Diagnosis not present

## 2020-10-09 DIAGNOSIS — M419 Scoliosis, unspecified: Secondary | ICD-10-CM | POA: Diagnosis not present

## 2020-10-09 DIAGNOSIS — I959 Hypotension, unspecified: Secondary | ICD-10-CM | POA: Diagnosis not present

## 2020-10-09 DIAGNOSIS — Z79899 Other long term (current) drug therapy: Secondary | ICD-10-CM | POA: Diagnosis not present

## 2020-10-09 DIAGNOSIS — J189 Pneumonia, unspecified organism: Secondary | ICD-10-CM | POA: Diagnosis not present

## 2020-10-09 DIAGNOSIS — R110 Nausea: Secondary | ICD-10-CM | POA: Diagnosis not present

## 2020-10-09 DIAGNOSIS — I5032 Chronic diastolic (congestive) heart failure: Secondary | ICD-10-CM | POA: Diagnosis not present

## 2020-10-09 DIAGNOSIS — R0602 Shortness of breath: Secondary | ICD-10-CM | POA: Diagnosis not present

## 2020-10-09 DIAGNOSIS — I081 Rheumatic disorders of both mitral and tricuspid valves: Secondary | ICD-10-CM | POA: Diagnosis not present

## 2020-10-09 DIAGNOSIS — R197 Diarrhea, unspecified: Secondary | ICD-10-CM | POA: Diagnosis not present

## 2020-10-09 DIAGNOSIS — D509 Iron deficiency anemia, unspecified: Secondary | ICD-10-CM | POA: Diagnosis not present

## 2020-10-10 DIAGNOSIS — Z950 Presence of cardiac pacemaker: Secondary | ICD-10-CM | POA: Diagnosis not present

## 2020-10-10 DIAGNOSIS — R0602 Shortness of breath: Secondary | ICD-10-CM | POA: Diagnosis not present

## 2020-10-10 DIAGNOSIS — J9601 Acute respiratory failure with hypoxia: Secondary | ICD-10-CM | POA: Diagnosis not present

## 2020-10-10 DIAGNOSIS — I5030 Unspecified diastolic (congestive) heart failure: Secondary | ICD-10-CM | POA: Diagnosis not present

## 2020-10-10 DIAGNOSIS — R197 Diarrhea, unspecified: Secondary | ICD-10-CM | POA: Diagnosis not present

## 2020-10-10 DIAGNOSIS — I5032 Chronic diastolic (congestive) heart failure: Secondary | ICD-10-CM | POA: Diagnosis not present

## 2020-10-10 DIAGNOSIS — I4891 Unspecified atrial fibrillation: Secondary | ICD-10-CM | POA: Diagnosis not present

## 2020-10-10 DIAGNOSIS — D509 Iron deficiency anemia, unspecified: Secondary | ICD-10-CM | POA: Diagnosis not present

## 2020-10-10 DIAGNOSIS — R7401 Elevation of levels of liver transaminase levels: Secondary | ICD-10-CM | POA: Diagnosis not present

## 2020-10-11 DIAGNOSIS — J9601 Acute respiratory failure with hypoxia: Secondary | ICD-10-CM | POA: Diagnosis not present

## 2020-10-11 DIAGNOSIS — I5040 Unspecified combined systolic (congestive) and diastolic (congestive) heart failure: Secondary | ICD-10-CM | POA: Diagnosis not present

## 2020-10-11 DIAGNOSIS — I4891 Unspecified atrial fibrillation: Secondary | ICD-10-CM | POA: Diagnosis not present

## 2020-10-11 DIAGNOSIS — R7401 Elevation of levels of liver transaminase levels: Secondary | ICD-10-CM | POA: Diagnosis not present

## 2020-10-11 DIAGNOSIS — I5030 Unspecified diastolic (congestive) heart failure: Secondary | ICD-10-CM | POA: Diagnosis not present

## 2020-10-11 DIAGNOSIS — D509 Iron deficiency anemia, unspecified: Secondary | ICD-10-CM | POA: Diagnosis not present

## 2020-10-11 DIAGNOSIS — I5032 Chronic diastolic (congestive) heart failure: Secondary | ICD-10-CM | POA: Diagnosis not present

## 2020-10-11 DIAGNOSIS — R197 Diarrhea, unspecified: Secondary | ICD-10-CM | POA: Diagnosis not present

## 2020-10-12 DIAGNOSIS — I5030 Unspecified diastolic (congestive) heart failure: Secondary | ICD-10-CM | POA: Diagnosis not present

## 2020-10-12 DIAGNOSIS — D509 Iron deficiency anemia, unspecified: Secondary | ICD-10-CM | POA: Diagnosis not present

## 2020-10-12 DIAGNOSIS — J9601 Acute respiratory failure with hypoxia: Secondary | ICD-10-CM | POA: Diagnosis not present

## 2020-10-12 DIAGNOSIS — Z9981 Dependence on supplemental oxygen: Secondary | ICD-10-CM | POA: Diagnosis not present

## 2020-10-15 DIAGNOSIS — Z9981 Dependence on supplemental oxygen: Secondary | ICD-10-CM | POA: Diagnosis not present

## 2020-10-15 DIAGNOSIS — J9601 Acute respiratory failure with hypoxia: Secondary | ICD-10-CM | POA: Diagnosis not present

## 2020-10-15 DIAGNOSIS — D509 Iron deficiency anemia, unspecified: Secondary | ICD-10-CM | POA: Diagnosis not present

## 2020-10-15 DIAGNOSIS — I5030 Unspecified diastolic (congestive) heart failure: Secondary | ICD-10-CM | POA: Diagnosis not present

## 2020-10-23 DIAGNOSIS — I5040 Unspecified combined systolic (congestive) and diastolic (congestive) heart failure: Secondary | ICD-10-CM | POA: Diagnosis not present

## 2020-10-25 DIAGNOSIS — Z950 Presence of cardiac pacemaker: Secondary | ICD-10-CM | POA: Diagnosis not present

## 2020-10-30 DIAGNOSIS — I5040 Unspecified combined systolic (congestive) and diastolic (congestive) heart failure: Secondary | ICD-10-CM | POA: Diagnosis not present

## 2020-10-30 DIAGNOSIS — I5032 Chronic diastolic (congestive) heart failure: Secondary | ICD-10-CM | POA: Diagnosis not present

## 2020-11-02 DIAGNOSIS — D649 Anemia, unspecified: Secondary | ICD-10-CM | POA: Diagnosis not present

## 2020-11-02 DIAGNOSIS — I509 Heart failure, unspecified: Secondary | ICD-10-CM | POA: Diagnosis not present

## 2020-11-11 DIAGNOSIS — I5032 Chronic diastolic (congestive) heart failure: Secondary | ICD-10-CM | POA: Diagnosis not present

## 2020-11-11 DIAGNOSIS — I5040 Unspecified combined systolic (congestive) and diastolic (congestive) heart failure: Secondary | ICD-10-CM | POA: Diagnosis not present

## 2020-11-18 DIAGNOSIS — R110 Nausea: Secondary | ICD-10-CM | POA: Diagnosis not present

## 2020-11-18 DIAGNOSIS — M059 Rheumatoid arthritis with rheumatoid factor, unspecified: Secondary | ICD-10-CM | POA: Diagnosis not present

## 2020-11-18 DIAGNOSIS — I495 Sick sinus syndrome: Secondary | ICD-10-CM | POA: Diagnosis not present

## 2020-11-18 DIAGNOSIS — G629 Polyneuropathy, unspecified: Secondary | ICD-10-CM | POA: Diagnosis not present

## 2020-11-18 DIAGNOSIS — I509 Heart failure, unspecified: Secondary | ICD-10-CM | POA: Diagnosis not present

## 2020-11-18 DIAGNOSIS — I480 Paroxysmal atrial fibrillation: Secondary | ICD-10-CM | POA: Diagnosis not present

## 2020-11-18 DIAGNOSIS — J449 Chronic obstructive pulmonary disease, unspecified: Secondary | ICD-10-CM | POA: Diagnosis not present

## 2020-11-18 DIAGNOSIS — N183 Chronic kidney disease, stage 3 unspecified: Secondary | ICD-10-CM | POA: Diagnosis not present

## 2020-11-18 DIAGNOSIS — Z950 Presence of cardiac pacemaker: Secondary | ICD-10-CM | POA: Diagnosis not present

## 2020-11-18 DIAGNOSIS — M479 Spondylosis, unspecified: Secondary | ICD-10-CM | POA: Diagnosis not present

## 2020-11-18 DIAGNOSIS — F3342 Major depressive disorder, recurrent, in full remission: Secondary | ICD-10-CM | POA: Diagnosis not present

## 2020-11-18 DIAGNOSIS — D6869 Other thrombophilia: Secondary | ICD-10-CM | POA: Diagnosis not present

## 2020-11-21 DIAGNOSIS — I495 Sick sinus syndrome: Secondary | ICD-10-CM | POA: Diagnosis not present

## 2020-11-21 DIAGNOSIS — Z45018 Encounter for adjustment and management of other part of cardiac pacemaker: Secondary | ICD-10-CM | POA: Diagnosis not present

## 2020-11-27 DIAGNOSIS — G894 Chronic pain syndrome: Secondary | ICD-10-CM | POA: Diagnosis not present

## 2020-11-27 DIAGNOSIS — G2581 Restless legs syndrome: Secondary | ICD-10-CM | POA: Diagnosis not present

## 2020-11-27 DIAGNOSIS — F1120 Opioid dependence, uncomplicated: Secondary | ICD-10-CM | POA: Diagnosis not present

## 2020-11-27 DIAGNOSIS — M5416 Radiculopathy, lumbar region: Secondary | ICD-10-CM | POA: Diagnosis not present

## 2020-11-27 DIAGNOSIS — M130 Polyarthritis, unspecified: Secondary | ICD-10-CM | POA: Diagnosis not present

## 2020-11-27 DIAGNOSIS — M5440 Lumbago with sciatica, unspecified side: Secondary | ICD-10-CM | POA: Diagnosis not present

## 2020-11-27 DIAGNOSIS — M419 Scoliosis, unspecified: Secondary | ICD-10-CM | POA: Diagnosis not present

## 2020-11-30 DIAGNOSIS — I5032 Chronic diastolic (congestive) heart failure: Secondary | ICD-10-CM | POA: Diagnosis not present

## 2020-11-30 DIAGNOSIS — I5040 Unspecified combined systolic (congestive) and diastolic (congestive) heart failure: Secondary | ICD-10-CM | POA: Diagnosis not present

## 2020-12-09 DIAGNOSIS — I5032 Chronic diastolic (congestive) heart failure: Secondary | ICD-10-CM | POA: Diagnosis not present

## 2020-12-09 DIAGNOSIS — I5040 Unspecified combined systolic (congestive) and diastolic (congestive) heart failure: Secondary | ICD-10-CM | POA: Diagnosis not present

## 2020-12-16 DIAGNOSIS — M419 Scoliosis, unspecified: Secondary | ICD-10-CM | POA: Diagnosis not present

## 2020-12-16 DIAGNOSIS — I5033 Acute on chronic diastolic (congestive) heart failure: Secondary | ICD-10-CM | POA: Diagnosis not present

## 2020-12-16 DIAGNOSIS — Z452 Encounter for adjustment and management of vascular access device: Secondary | ICD-10-CM | POA: Diagnosis not present

## 2020-12-16 DIAGNOSIS — J90 Pleural effusion, not elsewhere classified: Secondary | ICD-10-CM | POA: Diagnosis not present

## 2020-12-16 DIAGNOSIS — J811 Chronic pulmonary edema: Secondary | ICD-10-CM | POA: Diagnosis not present

## 2020-12-16 DIAGNOSIS — Z9981 Dependence on supplemental oxygen: Secondary | ICD-10-CM | POA: Diagnosis not present

## 2020-12-16 DIAGNOSIS — R069 Unspecified abnormalities of breathing: Secondary | ICD-10-CM | POA: Diagnosis not present

## 2020-12-16 DIAGNOSIS — J189 Pneumonia, unspecified organism: Secondary | ICD-10-CM | POA: Diagnosis not present

## 2020-12-16 DIAGNOSIS — J9621 Acute and chronic respiratory failure with hypoxia: Secondary | ICD-10-CM | POA: Diagnosis not present

## 2020-12-16 DIAGNOSIS — R1310 Dysphagia, unspecified: Secondary | ICD-10-CM | POA: Diagnosis not present

## 2020-12-16 DIAGNOSIS — I371 Nonrheumatic pulmonary valve insufficiency: Secondary | ICD-10-CM | POA: Diagnosis not present

## 2020-12-16 DIAGNOSIS — G928 Other toxic encephalopathy: Secondary | ICD-10-CM | POA: Diagnosis not present

## 2020-12-16 DIAGNOSIS — I700 Atherosclerosis of aorta: Secondary | ICD-10-CM | POA: Diagnosis not present

## 2020-12-16 DIAGNOSIS — I340 Nonrheumatic mitral (valve) insufficiency: Secondary | ICD-10-CM | POA: Diagnosis not present

## 2020-12-16 DIAGNOSIS — R278 Other lack of coordination: Secondary | ICD-10-CM | POA: Diagnosis not present

## 2020-12-16 DIAGNOSIS — I5040 Unspecified combined systolic (congestive) and diastolic (congestive) heart failure: Secondary | ICD-10-CM | POA: Diagnosis not present

## 2020-12-16 DIAGNOSIS — J440 Chronic obstructive pulmonary disease with acute lower respiratory infection: Secondary | ICD-10-CM | POA: Diagnosis not present

## 2020-12-16 DIAGNOSIS — I420 Dilated cardiomyopathy: Secondary | ICD-10-CM | POA: Diagnosis not present

## 2020-12-16 DIAGNOSIS — G9340 Encephalopathy, unspecified: Secondary | ICD-10-CM | POA: Diagnosis not present

## 2020-12-16 DIAGNOSIS — I959 Hypotension, unspecified: Secondary | ICD-10-CM | POA: Diagnosis not present

## 2020-12-16 DIAGNOSIS — I1310 Hypertensive heart and chronic kidney disease without heart failure, with stage 1 through stage 4 chronic kidney disease, or unspecified chronic kidney disease: Secondary | ICD-10-CM | POA: Diagnosis not present

## 2020-12-16 DIAGNOSIS — N189 Chronic kidney disease, unspecified: Secondary | ICD-10-CM | POA: Diagnosis not present

## 2020-12-16 DIAGNOSIS — R7989 Other specified abnormal findings of blood chemistry: Secondary | ICD-10-CM | POA: Diagnosis not present

## 2020-12-16 DIAGNOSIS — J9600 Acute respiratory failure, unspecified whether with hypoxia or hypercapnia: Secondary | ICD-10-CM | POA: Diagnosis not present

## 2020-12-16 DIAGNOSIS — R2689 Other abnormalities of gait and mobility: Secondary | ICD-10-CM | POA: Diagnosis not present

## 2020-12-16 DIAGNOSIS — I5020 Unspecified systolic (congestive) heart failure: Secondary | ICD-10-CM | POA: Diagnosis not present

## 2020-12-16 DIAGNOSIS — I10 Essential (primary) hypertension: Secondary | ICD-10-CM | POA: Diagnosis not present

## 2020-12-16 DIAGNOSIS — I495 Sick sinus syndrome: Secondary | ICD-10-CM | POA: Diagnosis not present

## 2020-12-16 DIAGNOSIS — I5023 Acute on chronic systolic (congestive) heart failure: Secondary | ICD-10-CM | POA: Diagnosis not present

## 2020-12-16 DIAGNOSIS — K449 Diaphragmatic hernia without obstruction or gangrene: Secondary | ICD-10-CM | POA: Diagnosis not present

## 2020-12-16 DIAGNOSIS — I672 Cerebral atherosclerosis: Secondary | ICD-10-CM | POA: Diagnosis not present

## 2020-12-16 DIAGNOSIS — I480 Paroxysmal atrial fibrillation: Secondary | ICD-10-CM | POA: Diagnosis not present

## 2020-12-16 DIAGNOSIS — I482 Chronic atrial fibrillation, unspecified: Secondary | ICD-10-CM | POA: Diagnosis not present

## 2020-12-16 DIAGNOSIS — I509 Heart failure, unspecified: Secondary | ICD-10-CM | POA: Diagnosis not present

## 2020-12-16 DIAGNOSIS — I361 Nonrheumatic tricuspid (valve) insufficiency: Secondary | ICD-10-CM | POA: Diagnosis not present

## 2020-12-16 DIAGNOSIS — Z781 Physical restraint status: Secondary | ICD-10-CM | POA: Diagnosis not present

## 2020-12-16 DIAGNOSIS — E876 Hypokalemia: Secondary | ICD-10-CM | POA: Diagnosis not present

## 2020-12-16 DIAGNOSIS — I6782 Cerebral ischemia: Secondary | ICD-10-CM | POA: Diagnosis not present

## 2020-12-16 DIAGNOSIS — D649 Anemia, unspecified: Secondary | ICD-10-CM | POA: Diagnosis not present

## 2020-12-16 DIAGNOSIS — R110 Nausea: Secondary | ICD-10-CM | POA: Diagnosis not present

## 2020-12-16 DIAGNOSIS — M6281 Muscle weakness (generalized): Secondary | ICD-10-CM | POA: Diagnosis not present

## 2020-12-16 DIAGNOSIS — I5030 Unspecified diastolic (congestive) heart failure: Secondary | ICD-10-CM | POA: Diagnosis not present

## 2020-12-16 DIAGNOSIS — Z20822 Contact with and (suspected) exposure to covid-19: Secondary | ICD-10-CM | POA: Diagnosis not present

## 2020-12-16 DIAGNOSIS — N3289 Other specified disorders of bladder: Secondary | ICD-10-CM | POA: Diagnosis not present

## 2020-12-16 DIAGNOSIS — Z950 Presence of cardiac pacemaker: Secondary | ICD-10-CM | POA: Diagnosis not present

## 2020-12-16 DIAGNOSIS — I5043 Acute on chronic combined systolic (congestive) and diastolic (congestive) heart failure: Secondary | ICD-10-CM | POA: Diagnosis not present

## 2020-12-16 DIAGNOSIS — I081 Rheumatic disorders of both mitral and tricuspid valves: Secondary | ICD-10-CM | POA: Diagnosis not present

## 2020-12-16 DIAGNOSIS — Z743 Need for continuous supervision: Secondary | ICD-10-CM | POA: Diagnosis not present

## 2020-12-16 DIAGNOSIS — E872 Acidosis: Secondary | ICD-10-CM | POA: Diagnosis not present

## 2020-12-16 DIAGNOSIS — E870 Hyperosmolality and hypernatremia: Secondary | ICD-10-CM | POA: Diagnosis not present

## 2020-12-16 DIAGNOSIS — M47814 Spondylosis without myelopathy or radiculopathy, thoracic region: Secondary | ICD-10-CM | POA: Diagnosis not present

## 2020-12-16 DIAGNOSIS — I130 Hypertensive heart and chronic kidney disease with heart failure and stage 1 through stage 4 chronic kidney disease, or unspecified chronic kidney disease: Secondary | ICD-10-CM | POA: Diagnosis not present

## 2020-12-16 DIAGNOSIS — I341 Nonrheumatic mitral (valve) prolapse: Secondary | ICD-10-CM | POA: Diagnosis not present

## 2020-12-16 DIAGNOSIS — Z4682 Encounter for fitting and adjustment of non-vascular catheter: Secondary | ICD-10-CM | POA: Diagnosis not present

## 2020-12-16 DIAGNOSIS — J3489 Other specified disorders of nose and nasal sinuses: Secondary | ICD-10-CM | POA: Diagnosis not present

## 2020-12-16 DIAGNOSIS — R6521 Severe sepsis with septic shock: Secondary | ICD-10-CM | POA: Diagnosis not present

## 2020-12-16 DIAGNOSIS — R570 Cardiogenic shock: Secondary | ICD-10-CM | POA: Diagnosis not present

## 2020-12-16 DIAGNOSIS — I110 Hypertensive heart disease with heart failure: Secondary | ICD-10-CM | POA: Diagnosis not present

## 2020-12-16 DIAGNOSIS — E8779 Other fluid overload: Secondary | ICD-10-CM | POA: Diagnosis not present

## 2020-12-16 DIAGNOSIS — R945 Abnormal results of liver function studies: Secondary | ICD-10-CM | POA: Diagnosis not present

## 2020-12-16 DIAGNOSIS — I5082 Biventricular heart failure: Secondary | ICD-10-CM | POA: Diagnosis not present

## 2020-12-16 DIAGNOSIS — N179 Acute kidney failure, unspecified: Secondary | ICD-10-CM | POA: Diagnosis not present

## 2020-12-16 DIAGNOSIS — I272 Pulmonary hypertension, unspecified: Secondary | ICD-10-CM | POA: Diagnosis not present

## 2020-12-16 DIAGNOSIS — E162 Hypoglycemia, unspecified: Secondary | ICD-10-CM | POA: Diagnosis not present

## 2020-12-16 DIAGNOSIS — J320 Chronic maxillary sinusitis: Secondary | ICD-10-CM | POA: Diagnosis not present

## 2020-12-16 DIAGNOSIS — R279 Unspecified lack of coordination: Secondary | ICD-10-CM | POA: Diagnosis not present

## 2020-12-16 DIAGNOSIS — E8770 Fluid overload, unspecified: Secondary | ICD-10-CM | POA: Diagnosis not present

## 2020-12-16 DIAGNOSIS — K7200 Acute and subacute hepatic failure without coma: Secondary | ICD-10-CM | POA: Diagnosis not present

## 2020-12-16 DIAGNOSIS — I517 Cardiomegaly: Secondary | ICD-10-CM | POA: Diagnosis not present

## 2020-12-16 DIAGNOSIS — I5031 Acute diastolic (congestive) heart failure: Secondary | ICD-10-CM | POA: Diagnosis not present

## 2020-12-16 DIAGNOSIS — I4891 Unspecified atrial fibrillation: Secondary | ICD-10-CM | POA: Diagnosis not present

## 2020-12-16 DIAGNOSIS — J810 Acute pulmonary edema: Secondary | ICD-10-CM | POA: Diagnosis not present

## 2020-12-16 DIAGNOSIS — R579 Shock, unspecified: Secondary | ICD-10-CM | POA: Diagnosis not present

## 2020-12-16 DIAGNOSIS — F339 Major depressive disorder, recurrent, unspecified: Secondary | ICD-10-CM | POA: Diagnosis not present

## 2020-12-16 DIAGNOSIS — I5021 Acute systolic (congestive) heart failure: Secondary | ICD-10-CM | POA: Diagnosis not present

## 2020-12-16 DIAGNOSIS — I5032 Chronic diastolic (congestive) heart failure: Secondary | ICD-10-CM | POA: Diagnosis not present

## 2020-12-16 DIAGNOSIS — Z7901 Long term (current) use of anticoagulants: Secondary | ICD-10-CM | POA: Diagnosis not present

## 2020-12-16 DIAGNOSIS — R0600 Dyspnea, unspecified: Secondary | ICD-10-CM | POA: Diagnosis not present

## 2020-12-16 DIAGNOSIS — R0602 Shortness of breath: Secondary | ICD-10-CM | POA: Diagnosis not present

## 2020-12-16 DIAGNOSIS — J9620 Acute and chronic respiratory failure, unspecified whether with hypoxia or hypercapnia: Secondary | ICD-10-CM | POA: Diagnosis not present

## 2020-12-16 DIAGNOSIS — J449 Chronic obstructive pulmonary disease, unspecified: Secondary | ICD-10-CM | POA: Diagnosis not present

## 2020-12-16 DIAGNOSIS — G9341 Metabolic encephalopathy: Secondary | ICD-10-CM | POA: Diagnosis not present

## 2020-12-16 DIAGNOSIS — R9431 Abnormal electrocardiogram [ECG] [EKG]: Secondary | ICD-10-CM | POA: Diagnosis not present

## 2020-12-16 DIAGNOSIS — D72829 Elevated white blood cell count, unspecified: Secondary | ICD-10-CM | POA: Diagnosis not present

## 2020-12-16 DIAGNOSIS — D509 Iron deficiency anemia, unspecified: Secondary | ICD-10-CM | POA: Diagnosis not present

## 2020-12-16 DIAGNOSIS — Z45010 Encounter for checking and testing of cardiac pacemaker pulse generator [battery]: Secondary | ICD-10-CM | POA: Diagnosis not present

## 2020-12-16 DIAGNOSIS — R609 Edema, unspecified: Secondary | ICD-10-CM | POA: Diagnosis not present

## 2020-12-16 DIAGNOSIS — F419 Anxiety disorder, unspecified: Secondary | ICD-10-CM | POA: Diagnosis not present

## 2020-12-16 DIAGNOSIS — K761 Chronic passive congestion of liver: Secondary | ICD-10-CM | POA: Diagnosis not present

## 2020-12-16 DIAGNOSIS — R0902 Hypoxemia: Secondary | ICD-10-CM | POA: Diagnosis not present

## 2020-12-16 DIAGNOSIS — J9601 Acute respiratory failure with hypoxia: Secondary | ICD-10-CM | POA: Diagnosis not present

## 2020-12-16 DIAGNOSIS — G2581 Restless legs syndrome: Secondary | ICD-10-CM | POA: Diagnosis not present

## 2020-12-16 DIAGNOSIS — K760 Fatty (change of) liver, not elsewhere classified: Secondary | ICD-10-CM | POA: Diagnosis not present

## 2020-12-16 DIAGNOSIS — A419 Sepsis, unspecified organism: Secondary | ICD-10-CM | POA: Diagnosis not present

## 2020-12-16 DIAGNOSIS — N183 Chronic kidney disease, stage 3 unspecified: Secondary | ICD-10-CM | POA: Diagnosis not present

## 2020-12-16 DIAGNOSIS — Z9911 Dependence on respirator [ventilator] status: Secondary | ICD-10-CM | POA: Diagnosis not present

## 2020-12-17 DIAGNOSIS — N179 Acute kidney failure, unspecified: Secondary | ICD-10-CM | POA: Diagnosis not present

## 2020-12-17 DIAGNOSIS — Z950 Presence of cardiac pacemaker: Secondary | ICD-10-CM | POA: Diagnosis not present

## 2020-12-17 DIAGNOSIS — I110 Hypertensive heart disease with heart failure: Secondary | ICD-10-CM | POA: Diagnosis not present

## 2020-12-17 DIAGNOSIS — R6521 Severe sepsis with septic shock: Secondary | ICD-10-CM | POA: Diagnosis not present

## 2020-12-17 DIAGNOSIS — A419 Sepsis, unspecified organism: Secondary | ICD-10-CM | POA: Diagnosis not present

## 2020-12-17 DIAGNOSIS — Z9981 Dependence on supplemental oxygen: Secondary | ICD-10-CM | POA: Diagnosis not present

## 2020-12-17 DIAGNOSIS — I5031 Acute diastolic (congestive) heart failure: Secondary | ICD-10-CM | POA: Diagnosis not present

## 2020-12-17 DIAGNOSIS — I420 Dilated cardiomyopathy: Secondary | ICD-10-CM | POA: Diagnosis not present

## 2020-12-17 DIAGNOSIS — I130 Hypertensive heart and chronic kidney disease with heart failure and stage 1 through stage 4 chronic kidney disease, or unspecified chronic kidney disease: Secondary | ICD-10-CM | POA: Diagnosis not present

## 2020-12-17 DIAGNOSIS — N183 Chronic kidney disease, stage 3 unspecified: Secondary | ICD-10-CM | POA: Diagnosis not present

## 2020-12-17 DIAGNOSIS — J9621 Acute and chronic respiratory failure with hypoxia: Secondary | ICD-10-CM | POA: Diagnosis not present

## 2020-12-17 DIAGNOSIS — E8770 Fluid overload, unspecified: Secondary | ICD-10-CM | POA: Diagnosis not present

## 2020-12-17 DIAGNOSIS — I495 Sick sinus syndrome: Secondary | ICD-10-CM | POA: Diagnosis not present

## 2020-12-17 DIAGNOSIS — D649 Anemia, unspecified: Secondary | ICD-10-CM | POA: Diagnosis not present

## 2020-12-17 DIAGNOSIS — I509 Heart failure, unspecified: Secondary | ICD-10-CM | POA: Diagnosis not present

## 2020-12-17 DIAGNOSIS — E876 Hypokalemia: Secondary | ICD-10-CM | POA: Diagnosis not present

## 2020-12-17 DIAGNOSIS — I480 Paroxysmal atrial fibrillation: Secondary | ICD-10-CM | POA: Diagnosis not present

## 2020-12-18 DIAGNOSIS — I5043 Acute on chronic combined systolic (congestive) and diastolic (congestive) heart failure: Secondary | ICD-10-CM | POA: Diagnosis not present

## 2020-12-18 DIAGNOSIS — I482 Chronic atrial fibrillation, unspecified: Secondary | ICD-10-CM | POA: Diagnosis not present

## 2020-12-18 DIAGNOSIS — E8779 Other fluid overload: Secondary | ICD-10-CM | POA: Diagnosis not present

## 2020-12-18 DIAGNOSIS — I5082 Biventricular heart failure: Secondary | ICD-10-CM | POA: Diagnosis not present

## 2020-12-18 DIAGNOSIS — I130 Hypertensive heart and chronic kidney disease with heart failure and stage 1 through stage 4 chronic kidney disease, or unspecified chronic kidney disease: Secondary | ICD-10-CM | POA: Diagnosis not present

## 2020-12-18 DIAGNOSIS — E8770 Fluid overload, unspecified: Secondary | ICD-10-CM | POA: Diagnosis not present

## 2020-12-18 DIAGNOSIS — I517 Cardiomegaly: Secondary | ICD-10-CM | POA: Diagnosis not present

## 2020-12-18 DIAGNOSIS — J90 Pleural effusion, not elsewhere classified: Secondary | ICD-10-CM | POA: Diagnosis not present

## 2020-12-18 DIAGNOSIS — Z7901 Long term (current) use of anticoagulants: Secondary | ICD-10-CM | POA: Diagnosis not present

## 2020-12-18 DIAGNOSIS — N183 Chronic kidney disease, stage 3 unspecified: Secondary | ICD-10-CM | POA: Diagnosis not present

## 2020-12-18 DIAGNOSIS — D509 Iron deficiency anemia, unspecified: Secondary | ICD-10-CM | POA: Diagnosis not present

## 2020-12-18 DIAGNOSIS — R0602 Shortness of breath: Secondary | ICD-10-CM | POA: Diagnosis not present

## 2020-12-18 DIAGNOSIS — R0902 Hypoxemia: Secondary | ICD-10-CM | POA: Diagnosis not present

## 2020-12-18 DIAGNOSIS — I340 Nonrheumatic mitral (valve) insufficiency: Secondary | ICD-10-CM | POA: Diagnosis not present

## 2020-12-18 DIAGNOSIS — Z9981 Dependence on supplemental oxygen: Secondary | ICD-10-CM | POA: Diagnosis not present

## 2020-12-18 DIAGNOSIS — I480 Paroxysmal atrial fibrillation: Secondary | ICD-10-CM | POA: Diagnosis not present

## 2020-12-18 DIAGNOSIS — J9621 Acute and chronic respiratory failure with hypoxia: Secondary | ICD-10-CM | POA: Diagnosis not present

## 2020-12-19 DIAGNOSIS — I480 Paroxysmal atrial fibrillation: Secondary | ICD-10-CM | POA: Diagnosis not present

## 2020-12-19 DIAGNOSIS — J9621 Acute and chronic respiratory failure with hypoxia: Secondary | ICD-10-CM | POA: Diagnosis not present

## 2020-12-19 DIAGNOSIS — Z7901 Long term (current) use of anticoagulants: Secondary | ICD-10-CM | POA: Diagnosis not present

## 2020-12-19 DIAGNOSIS — Z9981 Dependence on supplemental oxygen: Secondary | ICD-10-CM | POA: Diagnosis not present

## 2020-12-20 DIAGNOSIS — I5082 Biventricular heart failure: Secondary | ICD-10-CM | POA: Diagnosis not present

## 2020-12-20 DIAGNOSIS — N183 Chronic kidney disease, stage 3 unspecified: Secondary | ICD-10-CM | POA: Diagnosis not present

## 2020-12-20 DIAGNOSIS — D509 Iron deficiency anemia, unspecified: Secondary | ICD-10-CM | POA: Diagnosis not present

## 2020-12-20 DIAGNOSIS — J90 Pleural effusion, not elsewhere classified: Secondary | ICD-10-CM | POA: Diagnosis not present

## 2020-12-20 DIAGNOSIS — R579 Shock, unspecified: Secondary | ICD-10-CM | POA: Diagnosis not present

## 2020-12-20 DIAGNOSIS — I482 Chronic atrial fibrillation, unspecified: Secondary | ICD-10-CM | POA: Diagnosis not present

## 2020-12-20 DIAGNOSIS — I340 Nonrheumatic mitral (valve) insufficiency: Secondary | ICD-10-CM | POA: Diagnosis not present

## 2020-12-20 DIAGNOSIS — J9601 Acute respiratory failure with hypoxia: Secondary | ICD-10-CM | POA: Diagnosis not present

## 2020-12-20 DIAGNOSIS — K760 Fatty (change of) liver, not elsewhere classified: Secondary | ICD-10-CM | POA: Diagnosis not present

## 2020-12-20 DIAGNOSIS — J9621 Acute and chronic respiratory failure with hypoxia: Secondary | ICD-10-CM | POA: Diagnosis not present

## 2020-12-20 DIAGNOSIS — G9340 Encephalopathy, unspecified: Secondary | ICD-10-CM | POA: Diagnosis not present

## 2020-12-20 DIAGNOSIS — E8779 Other fluid overload: Secondary | ICD-10-CM | POA: Diagnosis not present

## 2020-12-20 DIAGNOSIS — R570 Cardiogenic shock: Secondary | ICD-10-CM | POA: Diagnosis not present

## 2020-12-20 DIAGNOSIS — I509 Heart failure, unspecified: Secondary | ICD-10-CM | POA: Diagnosis not present

## 2020-12-20 DIAGNOSIS — I517 Cardiomegaly: Secondary | ICD-10-CM | POA: Diagnosis not present

## 2020-12-20 DIAGNOSIS — I130 Hypertensive heart and chronic kidney disease with heart failure and stage 1 through stage 4 chronic kidney disease, or unspecified chronic kidney disease: Secondary | ICD-10-CM | POA: Diagnosis not present

## 2020-12-20 DIAGNOSIS — R0600 Dyspnea, unspecified: Secondary | ICD-10-CM | POA: Diagnosis not present

## 2020-12-20 DIAGNOSIS — R6521 Severe sepsis with septic shock: Secondary | ICD-10-CM | POA: Diagnosis not present

## 2020-12-20 DIAGNOSIS — A419 Sepsis, unspecified organism: Secondary | ICD-10-CM | POA: Diagnosis not present

## 2020-12-20 DIAGNOSIS — N179 Acute kidney failure, unspecified: Secondary | ICD-10-CM | POA: Diagnosis not present

## 2020-12-21 DIAGNOSIS — A419 Sepsis, unspecified organism: Secondary | ICD-10-CM | POA: Diagnosis not present

## 2020-12-21 DIAGNOSIS — N189 Chronic kidney disease, unspecified: Secondary | ICD-10-CM | POA: Diagnosis not present

## 2020-12-21 DIAGNOSIS — R6521 Severe sepsis with septic shock: Secondary | ICD-10-CM | POA: Diagnosis not present

## 2020-12-21 DIAGNOSIS — I5082 Biventricular heart failure: Secondary | ICD-10-CM | POA: Diagnosis not present

## 2020-12-21 DIAGNOSIS — G9340 Encephalopathy, unspecified: Secondary | ICD-10-CM | POA: Diagnosis not present

## 2020-12-21 DIAGNOSIS — I340 Nonrheumatic mitral (valve) insufficiency: Secondary | ICD-10-CM | POA: Diagnosis not present

## 2020-12-21 DIAGNOSIS — N179 Acute kidney failure, unspecified: Secondary | ICD-10-CM | POA: Diagnosis not present

## 2020-12-21 DIAGNOSIS — J9621 Acute and chronic respiratory failure with hypoxia: Secondary | ICD-10-CM | POA: Diagnosis not present

## 2020-12-21 DIAGNOSIS — D509 Iron deficiency anemia, unspecified: Secondary | ICD-10-CM | POA: Diagnosis not present

## 2020-12-21 DIAGNOSIS — Z9911 Dependence on respirator [ventilator] status: Secondary | ICD-10-CM | POA: Diagnosis not present

## 2020-12-21 DIAGNOSIS — R579 Shock, unspecified: Secondary | ICD-10-CM | POA: Diagnosis not present

## 2020-12-21 DIAGNOSIS — E8779 Other fluid overload: Secondary | ICD-10-CM | POA: Diagnosis not present

## 2020-12-21 DIAGNOSIS — J90 Pleural effusion, not elsewhere classified: Secondary | ICD-10-CM | POA: Diagnosis not present

## 2020-12-21 DIAGNOSIS — I272 Pulmonary hypertension, unspecified: Secondary | ICD-10-CM | POA: Diagnosis not present

## 2020-12-21 DIAGNOSIS — I482 Chronic atrial fibrillation, unspecified: Secondary | ICD-10-CM | POA: Diagnosis not present

## 2020-12-22 DIAGNOSIS — G9340 Encephalopathy, unspecified: Secondary | ICD-10-CM | POA: Diagnosis not present

## 2020-12-22 DIAGNOSIS — I272 Pulmonary hypertension, unspecified: Secondary | ICD-10-CM | POA: Diagnosis not present

## 2020-12-22 DIAGNOSIS — Z9911 Dependence on respirator [ventilator] status: Secondary | ICD-10-CM | POA: Diagnosis not present

## 2020-12-22 DIAGNOSIS — N189 Chronic kidney disease, unspecified: Secondary | ICD-10-CM | POA: Diagnosis not present

## 2020-12-22 DIAGNOSIS — I5082 Biventricular heart failure: Secondary | ICD-10-CM | POA: Diagnosis not present

## 2020-12-22 DIAGNOSIS — I482 Chronic atrial fibrillation, unspecified: Secondary | ICD-10-CM | POA: Diagnosis not present

## 2020-12-22 DIAGNOSIS — R579 Shock, unspecified: Secondary | ICD-10-CM | POA: Diagnosis not present

## 2020-12-22 DIAGNOSIS — J9621 Acute and chronic respiratory failure with hypoxia: Secondary | ICD-10-CM | POA: Diagnosis not present

## 2020-12-22 DIAGNOSIS — I340 Nonrheumatic mitral (valve) insufficiency: Secondary | ICD-10-CM | POA: Diagnosis not present

## 2020-12-22 DIAGNOSIS — D509 Iron deficiency anemia, unspecified: Secondary | ICD-10-CM | POA: Diagnosis not present

## 2020-12-22 DIAGNOSIS — N179 Acute kidney failure, unspecified: Secondary | ICD-10-CM | POA: Diagnosis not present

## 2020-12-22 DIAGNOSIS — R6521 Severe sepsis with septic shock: Secondary | ICD-10-CM | POA: Diagnosis not present

## 2020-12-22 DIAGNOSIS — A419 Sepsis, unspecified organism: Secondary | ICD-10-CM | POA: Diagnosis not present

## 2020-12-22 DIAGNOSIS — E8779 Other fluid overload: Secondary | ICD-10-CM | POA: Diagnosis not present

## 2020-12-23 DIAGNOSIS — I5082 Biventricular heart failure: Secondary | ICD-10-CM | POA: Diagnosis not present

## 2020-12-23 DIAGNOSIS — I340 Nonrheumatic mitral (valve) insufficiency: Secondary | ICD-10-CM | POA: Diagnosis not present

## 2020-12-23 DIAGNOSIS — R945 Abnormal results of liver function studies: Secondary | ICD-10-CM | POA: Diagnosis not present

## 2020-12-23 DIAGNOSIS — I480 Paroxysmal atrial fibrillation: Secondary | ICD-10-CM | POA: Diagnosis not present

## 2020-12-23 DIAGNOSIS — I10 Essential (primary) hypertension: Secondary | ICD-10-CM | POA: Diagnosis not present

## 2020-12-23 DIAGNOSIS — I5020 Unspecified systolic (congestive) heart failure: Secondary | ICD-10-CM | POA: Diagnosis not present

## 2020-12-23 DIAGNOSIS — N179 Acute kidney failure, unspecified: Secondary | ICD-10-CM | POA: Diagnosis not present

## 2020-12-23 DIAGNOSIS — J811 Chronic pulmonary edema: Secondary | ICD-10-CM | POA: Diagnosis not present

## 2020-12-23 DIAGNOSIS — Z950 Presence of cardiac pacemaker: Secondary | ICD-10-CM | POA: Diagnosis not present

## 2020-12-23 DIAGNOSIS — R570 Cardiogenic shock: Secondary | ICD-10-CM | POA: Diagnosis not present

## 2020-12-23 DIAGNOSIS — J90 Pleural effusion, not elsewhere classified: Secondary | ICD-10-CM | POA: Diagnosis not present

## 2020-12-23 DIAGNOSIS — I517 Cardiomegaly: Secondary | ICD-10-CM | POA: Diagnosis not present

## 2020-12-23 DIAGNOSIS — I509 Heart failure, unspecified: Secondary | ICD-10-CM | POA: Diagnosis not present

## 2020-12-23 DIAGNOSIS — R0902 Hypoxemia: Secondary | ICD-10-CM | POA: Diagnosis not present

## 2020-12-24 DIAGNOSIS — I361 Nonrheumatic tricuspid (valve) insufficiency: Secondary | ICD-10-CM | POA: Diagnosis not present

## 2020-12-24 DIAGNOSIS — Z9911 Dependence on respirator [ventilator] status: Secondary | ICD-10-CM | POA: Diagnosis not present

## 2020-12-24 DIAGNOSIS — I509 Heart failure, unspecified: Secondary | ICD-10-CM | POA: Diagnosis not present

## 2020-12-24 DIAGNOSIS — J9621 Acute and chronic respiratory failure with hypoxia: Secondary | ICD-10-CM | POA: Diagnosis not present

## 2020-12-24 DIAGNOSIS — I480 Paroxysmal atrial fibrillation: Secondary | ICD-10-CM | POA: Diagnosis not present

## 2020-12-24 DIAGNOSIS — I371 Nonrheumatic pulmonary valve insufficiency: Secondary | ICD-10-CM | POA: Diagnosis not present

## 2020-12-24 DIAGNOSIS — I110 Hypertensive heart disease with heart failure: Secondary | ICD-10-CM | POA: Diagnosis not present

## 2020-12-24 DIAGNOSIS — R579 Shock, unspecified: Secondary | ICD-10-CM | POA: Diagnosis not present

## 2020-12-24 DIAGNOSIS — I5023 Acute on chronic systolic (congestive) heart failure: Secondary | ICD-10-CM | POA: Diagnosis not present

## 2020-12-24 DIAGNOSIS — I5082 Biventricular heart failure: Secondary | ICD-10-CM | POA: Diagnosis not present

## 2020-12-24 DIAGNOSIS — I341 Nonrheumatic mitral (valve) prolapse: Secondary | ICD-10-CM | POA: Diagnosis not present

## 2020-12-24 DIAGNOSIS — Z950 Presence of cardiac pacemaker: Secondary | ICD-10-CM | POA: Diagnosis not present

## 2020-12-24 DIAGNOSIS — N179 Acute kidney failure, unspecified: Secondary | ICD-10-CM | POA: Diagnosis not present

## 2020-12-24 DIAGNOSIS — I340 Nonrheumatic mitral (valve) insufficiency: Secondary | ICD-10-CM | POA: Diagnosis not present

## 2020-12-25 DIAGNOSIS — I480 Paroxysmal atrial fibrillation: Secondary | ICD-10-CM | POA: Diagnosis not present

## 2020-12-25 DIAGNOSIS — N179 Acute kidney failure, unspecified: Secondary | ICD-10-CM | POA: Diagnosis not present

## 2020-12-25 DIAGNOSIS — I509 Heart failure, unspecified: Secondary | ICD-10-CM | POA: Diagnosis not present

## 2020-12-25 DIAGNOSIS — R579 Shock, unspecified: Secondary | ICD-10-CM | POA: Diagnosis not present

## 2020-12-25 DIAGNOSIS — Z950 Presence of cardiac pacemaker: Secondary | ICD-10-CM | POA: Diagnosis not present

## 2020-12-25 DIAGNOSIS — J9621 Acute and chronic respiratory failure with hypoxia: Secondary | ICD-10-CM | POA: Diagnosis not present

## 2020-12-25 DIAGNOSIS — Z4682 Encounter for fitting and adjustment of non-vascular catheter: Secondary | ICD-10-CM | POA: Diagnosis not present

## 2020-12-25 DIAGNOSIS — I5023 Acute on chronic systolic (congestive) heart failure: Secondary | ICD-10-CM | POA: Diagnosis not present

## 2020-12-25 DIAGNOSIS — I5082 Biventricular heart failure: Secondary | ICD-10-CM | POA: Diagnosis not present

## 2020-12-25 DIAGNOSIS — I110 Hypertensive heart disease with heart failure: Secondary | ICD-10-CM | POA: Diagnosis not present

## 2020-12-25 DIAGNOSIS — I340 Nonrheumatic mitral (valve) insufficiency: Secondary | ICD-10-CM | POA: Diagnosis not present

## 2020-12-25 DIAGNOSIS — Z452 Encounter for adjustment and management of vascular access device: Secondary | ICD-10-CM | POA: Diagnosis not present

## 2020-12-25 DIAGNOSIS — Z9911 Dependence on respirator [ventilator] status: Secondary | ICD-10-CM | POA: Diagnosis not present

## 2020-12-25 DIAGNOSIS — M419 Scoliosis, unspecified: Secondary | ICD-10-CM | POA: Diagnosis not present

## 2020-12-26 DIAGNOSIS — I509 Heart failure, unspecified: Secondary | ICD-10-CM | POA: Diagnosis not present

## 2020-12-26 DIAGNOSIS — R579 Shock, unspecified: Secondary | ICD-10-CM | POA: Diagnosis not present

## 2020-12-26 DIAGNOSIS — N179 Acute kidney failure, unspecified: Secondary | ICD-10-CM | POA: Diagnosis not present

## 2020-12-26 DIAGNOSIS — I5082 Biventricular heart failure: Secondary | ICD-10-CM | POA: Diagnosis not present

## 2020-12-26 DIAGNOSIS — J9621 Acute and chronic respiratory failure with hypoxia: Secondary | ICD-10-CM | POA: Diagnosis not present

## 2020-12-26 DIAGNOSIS — I5023 Acute on chronic systolic (congestive) heart failure: Secondary | ICD-10-CM | POA: Diagnosis not present

## 2020-12-26 DIAGNOSIS — Z950 Presence of cardiac pacemaker: Secondary | ICD-10-CM | POA: Diagnosis not present

## 2020-12-26 DIAGNOSIS — I480 Paroxysmal atrial fibrillation: Secondary | ICD-10-CM | POA: Diagnosis not present

## 2020-12-26 DIAGNOSIS — Z9911 Dependence on respirator [ventilator] status: Secondary | ICD-10-CM | POA: Diagnosis not present

## 2020-12-26 DIAGNOSIS — I340 Nonrheumatic mitral (valve) insufficiency: Secondary | ICD-10-CM | POA: Diagnosis not present

## 2020-12-26 DIAGNOSIS — I110 Hypertensive heart disease with heart failure: Secondary | ICD-10-CM | POA: Diagnosis not present

## 2020-12-27 DIAGNOSIS — I509 Heart failure, unspecified: Secondary | ICD-10-CM | POA: Diagnosis not present

## 2020-12-27 DIAGNOSIS — R579 Shock, unspecified: Secondary | ICD-10-CM | POA: Diagnosis not present

## 2020-12-27 DIAGNOSIS — J9601 Acute respiratory failure with hypoxia: Secondary | ICD-10-CM | POA: Diagnosis not present

## 2020-12-27 DIAGNOSIS — Z950 Presence of cardiac pacemaker: Secondary | ICD-10-CM | POA: Diagnosis not present

## 2020-12-27 DIAGNOSIS — J9621 Acute and chronic respiratory failure with hypoxia: Secondary | ICD-10-CM | POA: Diagnosis not present

## 2020-12-27 DIAGNOSIS — G9340 Encephalopathy, unspecified: Secondary | ICD-10-CM | POA: Diagnosis not present

## 2020-12-27 DIAGNOSIS — I5082 Biventricular heart failure: Secondary | ICD-10-CM | POA: Diagnosis not present

## 2020-12-27 DIAGNOSIS — I480 Paroxysmal atrial fibrillation: Secondary | ICD-10-CM | POA: Diagnosis not present

## 2020-12-27 DIAGNOSIS — I672 Cerebral atherosclerosis: Secondary | ICD-10-CM | POA: Diagnosis not present

## 2020-12-27 DIAGNOSIS — I340 Nonrheumatic mitral (valve) insufficiency: Secondary | ICD-10-CM | POA: Diagnosis not present

## 2020-12-27 DIAGNOSIS — J3489 Other specified disorders of nose and nasal sinuses: Secondary | ICD-10-CM | POA: Diagnosis not present

## 2020-12-27 DIAGNOSIS — N179 Acute kidney failure, unspecified: Secondary | ICD-10-CM | POA: Diagnosis not present

## 2020-12-27 DIAGNOSIS — J320 Chronic maxillary sinusitis: Secondary | ICD-10-CM | POA: Diagnosis not present

## 2020-12-28 DIAGNOSIS — G9340 Encephalopathy, unspecified: Secondary | ICD-10-CM | POA: Diagnosis not present

## 2020-12-28 DIAGNOSIS — I509 Heart failure, unspecified: Secondary | ICD-10-CM | POA: Diagnosis not present

## 2020-12-28 DIAGNOSIS — Z950 Presence of cardiac pacemaker: Secondary | ICD-10-CM | POA: Diagnosis not present

## 2020-12-28 DIAGNOSIS — I480 Paroxysmal atrial fibrillation: Secondary | ICD-10-CM | POA: Diagnosis not present

## 2020-12-28 DIAGNOSIS — I5082 Biventricular heart failure: Secondary | ICD-10-CM | POA: Diagnosis not present

## 2020-12-28 DIAGNOSIS — N179 Acute kidney failure, unspecified: Secondary | ICD-10-CM | POA: Diagnosis not present

## 2020-12-28 DIAGNOSIS — I5032 Chronic diastolic (congestive) heart failure: Secondary | ICD-10-CM | POA: Diagnosis not present

## 2020-12-28 DIAGNOSIS — R579 Shock, unspecified: Secondary | ICD-10-CM | POA: Diagnosis not present

## 2020-12-28 DIAGNOSIS — I340 Nonrheumatic mitral (valve) insufficiency: Secondary | ICD-10-CM | POA: Diagnosis not present

## 2020-12-28 DIAGNOSIS — J9621 Acute and chronic respiratory failure with hypoxia: Secondary | ICD-10-CM | POA: Diagnosis not present

## 2020-12-28 DIAGNOSIS — I5040 Unspecified combined systolic (congestive) and diastolic (congestive) heart failure: Secondary | ICD-10-CM | POA: Diagnosis not present

## 2020-12-29 DIAGNOSIS — Z950 Presence of cardiac pacemaker: Secondary | ICD-10-CM | POA: Diagnosis not present

## 2020-12-29 DIAGNOSIS — J9621 Acute and chronic respiratory failure with hypoxia: Secondary | ICD-10-CM | POA: Diagnosis not present

## 2020-12-29 DIAGNOSIS — I340 Nonrheumatic mitral (valve) insufficiency: Secondary | ICD-10-CM | POA: Diagnosis not present

## 2020-12-29 DIAGNOSIS — I5082 Biventricular heart failure: Secondary | ICD-10-CM | POA: Diagnosis not present

## 2020-12-29 DIAGNOSIS — I480 Paroxysmal atrial fibrillation: Secondary | ICD-10-CM | POA: Diagnosis not present

## 2020-12-29 DIAGNOSIS — N179 Acute kidney failure, unspecified: Secondary | ICD-10-CM | POA: Diagnosis not present

## 2020-12-29 DIAGNOSIS — G9340 Encephalopathy, unspecified: Secondary | ICD-10-CM | POA: Diagnosis not present

## 2020-12-29 DIAGNOSIS — R579 Shock, unspecified: Secondary | ICD-10-CM | POA: Diagnosis not present

## 2020-12-29 DIAGNOSIS — I509 Heart failure, unspecified: Secondary | ICD-10-CM | POA: Diagnosis not present

## 2020-12-30 DIAGNOSIS — I5082 Biventricular heart failure: Secondary | ICD-10-CM | POA: Diagnosis not present

## 2020-12-30 DIAGNOSIS — I480 Paroxysmal atrial fibrillation: Secondary | ICD-10-CM | POA: Diagnosis not present

## 2020-12-30 DIAGNOSIS — N179 Acute kidney failure, unspecified: Secondary | ICD-10-CM | POA: Diagnosis not present

## 2020-12-30 DIAGNOSIS — J9621 Acute and chronic respiratory failure with hypoxia: Secondary | ICD-10-CM | POA: Diagnosis not present

## 2020-12-30 DIAGNOSIS — I509 Heart failure, unspecified: Secondary | ICD-10-CM | POA: Diagnosis not present

## 2020-12-30 DIAGNOSIS — Z950 Presence of cardiac pacemaker: Secondary | ICD-10-CM | POA: Diagnosis not present

## 2020-12-30 DIAGNOSIS — G9340 Encephalopathy, unspecified: Secondary | ICD-10-CM | POA: Diagnosis not present

## 2020-12-30 DIAGNOSIS — I340 Nonrheumatic mitral (valve) insufficiency: Secondary | ICD-10-CM | POA: Diagnosis not present

## 2020-12-31 DIAGNOSIS — I1310 Hypertensive heart and chronic kidney disease without heart failure, with stage 1 through stage 4 chronic kidney disease, or unspecified chronic kidney disease: Secondary | ICD-10-CM | POA: Diagnosis not present

## 2020-12-31 DIAGNOSIS — R0602 Shortness of breath: Secondary | ICD-10-CM | POA: Diagnosis not present

## 2020-12-31 DIAGNOSIS — E870 Hyperosmolality and hypernatremia: Secondary | ICD-10-CM | POA: Diagnosis not present

## 2020-12-31 DIAGNOSIS — K7200 Acute and subacute hepatic failure without coma: Secondary | ICD-10-CM | POA: Diagnosis not present

## 2020-12-31 DIAGNOSIS — J9600 Acute respiratory failure, unspecified whether with hypoxia or hypercapnia: Secondary | ICD-10-CM | POA: Diagnosis not present

## 2020-12-31 DIAGNOSIS — I480 Paroxysmal atrial fibrillation: Secondary | ICD-10-CM | POA: Diagnosis not present

## 2020-12-31 DIAGNOSIS — Z9981 Dependence on supplemental oxygen: Secondary | ICD-10-CM | POA: Diagnosis not present

## 2020-12-31 DIAGNOSIS — G9340 Encephalopathy, unspecified: Secondary | ICD-10-CM | POA: Diagnosis not present

## 2020-12-31 DIAGNOSIS — N179 Acute kidney failure, unspecified: Secondary | ICD-10-CM | POA: Diagnosis not present

## 2020-12-31 DIAGNOSIS — Z950 Presence of cardiac pacemaker: Secondary | ICD-10-CM | POA: Diagnosis not present

## 2020-12-31 DIAGNOSIS — N183 Chronic kidney disease, stage 3 unspecified: Secondary | ICD-10-CM | POA: Diagnosis not present

## 2020-12-31 DIAGNOSIS — I081 Rheumatic disorders of both mitral and tricuspid valves: Secondary | ICD-10-CM | POA: Diagnosis not present

## 2020-12-31 DIAGNOSIS — J810 Acute pulmonary edema: Secondary | ICD-10-CM | POA: Diagnosis not present

## 2020-12-31 DIAGNOSIS — J811 Chronic pulmonary edema: Secondary | ICD-10-CM | POA: Diagnosis not present

## 2020-12-31 DIAGNOSIS — D72829 Elevated white blood cell count, unspecified: Secondary | ICD-10-CM | POA: Diagnosis not present

## 2020-12-31 DIAGNOSIS — R570 Cardiogenic shock: Secondary | ICD-10-CM | POA: Diagnosis not present

## 2020-12-31 DIAGNOSIS — I495 Sick sinus syndrome: Secondary | ICD-10-CM | POA: Diagnosis not present

## 2020-12-31 DIAGNOSIS — J9621 Acute and chronic respiratory failure with hypoxia: Secondary | ICD-10-CM | POA: Diagnosis not present

## 2021-01-01 DIAGNOSIS — J810 Acute pulmonary edema: Secondary | ICD-10-CM | POA: Diagnosis not present

## 2021-01-01 DIAGNOSIS — I480 Paroxysmal atrial fibrillation: Secondary | ICD-10-CM | POA: Diagnosis not present

## 2021-01-01 DIAGNOSIS — Z950 Presence of cardiac pacemaker: Secondary | ICD-10-CM | POA: Diagnosis not present

## 2021-01-01 DIAGNOSIS — E870 Hyperosmolality and hypernatremia: Secondary | ICD-10-CM | POA: Diagnosis not present

## 2021-01-01 DIAGNOSIS — G9340 Encephalopathy, unspecified: Secondary | ICD-10-CM | POA: Diagnosis not present

## 2021-01-01 DIAGNOSIS — J9600 Acute respiratory failure, unspecified whether with hypoxia or hypercapnia: Secondary | ICD-10-CM | POA: Diagnosis not present

## 2021-01-01 DIAGNOSIS — I495 Sick sinus syndrome: Secondary | ICD-10-CM | POA: Diagnosis not present

## 2021-01-01 DIAGNOSIS — I509 Heart failure, unspecified: Secondary | ICD-10-CM | POA: Diagnosis not present

## 2021-01-01 DIAGNOSIS — D509 Iron deficiency anemia, unspecified: Secondary | ICD-10-CM | POA: Diagnosis not present

## 2021-01-01 DIAGNOSIS — K7200 Acute and subacute hepatic failure without coma: Secondary | ICD-10-CM | POA: Diagnosis not present

## 2021-01-01 DIAGNOSIS — J9621 Acute and chronic respiratory failure with hypoxia: Secondary | ICD-10-CM | POA: Diagnosis not present

## 2021-01-01 DIAGNOSIS — R1310 Dysphagia, unspecified: Secondary | ICD-10-CM | POA: Diagnosis not present

## 2021-01-01 DIAGNOSIS — I1310 Hypertensive heart and chronic kidney disease without heart failure, with stage 1 through stage 4 chronic kidney disease, or unspecified chronic kidney disease: Secondary | ICD-10-CM | POA: Diagnosis not present

## 2021-01-01 DIAGNOSIS — I081 Rheumatic disorders of both mitral and tricuspid valves: Secondary | ICD-10-CM | POA: Diagnosis not present

## 2021-01-01 DIAGNOSIS — N183 Chronic kidney disease, stage 3 unspecified: Secondary | ICD-10-CM | POA: Diagnosis not present

## 2021-01-01 DIAGNOSIS — N179 Acute kidney failure, unspecified: Secondary | ICD-10-CM | POA: Diagnosis not present

## 2021-01-01 DIAGNOSIS — Z9981 Dependence on supplemental oxygen: Secondary | ICD-10-CM | POA: Diagnosis not present

## 2021-01-01 DIAGNOSIS — R570 Cardiogenic shock: Secondary | ICD-10-CM | POA: Diagnosis not present

## 2021-01-01 DIAGNOSIS — R945 Abnormal results of liver function studies: Secondary | ICD-10-CM | POA: Diagnosis not present

## 2021-01-02 DIAGNOSIS — G9340 Encephalopathy, unspecified: Secondary | ICD-10-CM | POA: Diagnosis not present

## 2021-01-02 DIAGNOSIS — Z7901 Long term (current) use of anticoagulants: Secondary | ICD-10-CM | POA: Diagnosis not present

## 2021-01-02 DIAGNOSIS — R1310 Dysphagia, unspecified: Secondary | ICD-10-CM | POA: Diagnosis not present

## 2021-01-02 DIAGNOSIS — N183 Chronic kidney disease, stage 3 unspecified: Secondary | ICD-10-CM | POA: Diagnosis not present

## 2021-01-02 DIAGNOSIS — R945 Abnormal results of liver function studies: Secondary | ICD-10-CM | POA: Diagnosis not present

## 2021-01-02 DIAGNOSIS — I130 Hypertensive heart and chronic kidney disease with heart failure and stage 1 through stage 4 chronic kidney disease, or unspecified chronic kidney disease: Secondary | ICD-10-CM | POA: Diagnosis not present

## 2021-01-02 DIAGNOSIS — J9621 Acute and chronic respiratory failure with hypoxia: Secondary | ICD-10-CM | POA: Diagnosis not present

## 2021-01-02 DIAGNOSIS — Z9981 Dependence on supplemental oxygen: Secondary | ICD-10-CM | POA: Diagnosis not present

## 2021-01-02 DIAGNOSIS — I480 Paroxysmal atrial fibrillation: Secondary | ICD-10-CM | POA: Diagnosis not present

## 2021-01-02 DIAGNOSIS — N179 Acute kidney failure, unspecified: Secondary | ICD-10-CM | POA: Diagnosis not present

## 2021-01-02 DIAGNOSIS — I509 Heart failure, unspecified: Secondary | ICD-10-CM | POA: Diagnosis not present

## 2021-01-02 DIAGNOSIS — D509 Iron deficiency anemia, unspecified: Secondary | ICD-10-CM | POA: Diagnosis not present

## 2021-01-03 DIAGNOSIS — I509 Heart failure, unspecified: Secondary | ICD-10-CM | POA: Diagnosis not present

## 2021-01-03 DIAGNOSIS — R1310 Dysphagia, unspecified: Secondary | ICD-10-CM | POA: Diagnosis not present

## 2021-01-03 DIAGNOSIS — N179 Acute kidney failure, unspecified: Secondary | ICD-10-CM | POA: Diagnosis not present

## 2021-01-03 DIAGNOSIS — G928 Other toxic encephalopathy: Secondary | ICD-10-CM | POA: Diagnosis not present

## 2021-01-03 DIAGNOSIS — I6782 Cerebral ischemia: Secondary | ICD-10-CM | POA: Diagnosis not present

## 2021-01-03 DIAGNOSIS — E870 Hyperosmolality and hypernatremia: Secondary | ICD-10-CM | POA: Diagnosis not present

## 2021-01-03 DIAGNOSIS — G9340 Encephalopathy, unspecified: Secondary | ICD-10-CM | POA: Diagnosis not present

## 2021-01-03 DIAGNOSIS — D649 Anemia, unspecified: Secondary | ICD-10-CM | POA: Diagnosis not present

## 2021-01-03 DIAGNOSIS — Z9981 Dependence on supplemental oxygen: Secondary | ICD-10-CM | POA: Diagnosis not present

## 2021-01-03 DIAGNOSIS — J9621 Acute and chronic respiratory failure with hypoxia: Secondary | ICD-10-CM | POA: Diagnosis not present

## 2021-01-03 DIAGNOSIS — R945 Abnormal results of liver function studies: Secondary | ICD-10-CM | POA: Diagnosis not present

## 2021-01-03 DIAGNOSIS — D509 Iron deficiency anemia, unspecified: Secondary | ICD-10-CM | POA: Diagnosis not present

## 2021-01-03 DIAGNOSIS — I4891 Unspecified atrial fibrillation: Secondary | ICD-10-CM | POA: Diagnosis not present

## 2021-01-04 DIAGNOSIS — R945 Abnormal results of liver function studies: Secondary | ICD-10-CM | POA: Diagnosis not present

## 2021-01-04 DIAGNOSIS — E870 Hyperosmolality and hypernatremia: Secondary | ICD-10-CM | POA: Diagnosis not present

## 2021-01-04 DIAGNOSIS — I509 Heart failure, unspecified: Secondary | ICD-10-CM | POA: Diagnosis not present

## 2021-01-04 DIAGNOSIS — G9340 Encephalopathy, unspecified: Secondary | ICD-10-CM | POA: Diagnosis not present

## 2021-01-04 DIAGNOSIS — Z9981 Dependence on supplemental oxygen: Secondary | ICD-10-CM | POA: Diagnosis not present

## 2021-01-04 DIAGNOSIS — N179 Acute kidney failure, unspecified: Secondary | ICD-10-CM | POA: Diagnosis not present

## 2021-01-04 DIAGNOSIS — D509 Iron deficiency anemia, unspecified: Secondary | ICD-10-CM | POA: Diagnosis not present

## 2021-01-04 DIAGNOSIS — J9621 Acute and chronic respiratory failure with hypoxia: Secondary | ICD-10-CM | POA: Diagnosis not present

## 2021-01-04 DIAGNOSIS — R1310 Dysphagia, unspecified: Secondary | ICD-10-CM | POA: Diagnosis not present

## 2021-01-05 DIAGNOSIS — R1310 Dysphagia, unspecified: Secondary | ICD-10-CM | POA: Diagnosis not present

## 2021-01-05 DIAGNOSIS — I509 Heart failure, unspecified: Secondary | ICD-10-CM | POA: Diagnosis not present

## 2021-01-05 DIAGNOSIS — Z9981 Dependence on supplemental oxygen: Secondary | ICD-10-CM | POA: Diagnosis not present

## 2021-01-05 DIAGNOSIS — D509 Iron deficiency anemia, unspecified: Secondary | ICD-10-CM | POA: Diagnosis not present

## 2021-01-05 DIAGNOSIS — E870 Hyperosmolality and hypernatremia: Secondary | ICD-10-CM | POA: Diagnosis not present

## 2021-01-05 DIAGNOSIS — N179 Acute kidney failure, unspecified: Secondary | ICD-10-CM | POA: Diagnosis not present

## 2021-01-05 DIAGNOSIS — R945 Abnormal results of liver function studies: Secondary | ICD-10-CM | POA: Diagnosis not present

## 2021-01-05 DIAGNOSIS — G9340 Encephalopathy, unspecified: Secondary | ICD-10-CM | POA: Diagnosis not present

## 2021-01-05 DIAGNOSIS — J9621 Acute and chronic respiratory failure with hypoxia: Secondary | ICD-10-CM | POA: Diagnosis not present

## 2021-01-06 DIAGNOSIS — D509 Iron deficiency anemia, unspecified: Secondary | ICD-10-CM | POA: Diagnosis not present

## 2021-01-06 DIAGNOSIS — Z9981 Dependence on supplemental oxygen: Secondary | ICD-10-CM | POA: Diagnosis not present

## 2021-01-06 DIAGNOSIS — R1310 Dysphagia, unspecified: Secondary | ICD-10-CM | POA: Diagnosis not present

## 2021-01-06 DIAGNOSIS — K449 Diaphragmatic hernia without obstruction or gangrene: Secondary | ICD-10-CM | POA: Diagnosis not present

## 2021-01-06 DIAGNOSIS — N3289 Other specified disorders of bladder: Secondary | ICD-10-CM | POA: Diagnosis not present

## 2021-01-06 DIAGNOSIS — R110 Nausea: Secondary | ICD-10-CM | POA: Diagnosis not present

## 2021-01-06 DIAGNOSIS — N179 Acute kidney failure, unspecified: Secondary | ICD-10-CM | POA: Diagnosis not present

## 2021-01-06 DIAGNOSIS — M47814 Spondylosis without myelopathy or radiculopathy, thoracic region: Secondary | ICD-10-CM | POA: Diagnosis not present

## 2021-01-06 DIAGNOSIS — J9621 Acute and chronic respiratory failure with hypoxia: Secondary | ICD-10-CM | POA: Diagnosis not present

## 2021-01-06 DIAGNOSIS — R945 Abnormal results of liver function studies: Secondary | ICD-10-CM | POA: Diagnosis not present

## 2021-01-06 DIAGNOSIS — G9340 Encephalopathy, unspecified: Secondary | ICD-10-CM | POA: Diagnosis not present

## 2021-01-07 DIAGNOSIS — E870 Hyperosmolality and hypernatremia: Secondary | ICD-10-CM | POA: Diagnosis not present

## 2021-01-07 DIAGNOSIS — G9340 Encephalopathy, unspecified: Secondary | ICD-10-CM | POA: Diagnosis not present

## 2021-01-07 DIAGNOSIS — N179 Acute kidney failure, unspecified: Secondary | ICD-10-CM | POA: Diagnosis not present

## 2021-01-07 DIAGNOSIS — D509 Iron deficiency anemia, unspecified: Secondary | ICD-10-CM | POA: Diagnosis not present

## 2021-01-07 DIAGNOSIS — R1310 Dysphagia, unspecified: Secondary | ICD-10-CM | POA: Diagnosis not present

## 2021-01-07 DIAGNOSIS — J9621 Acute and chronic respiratory failure with hypoxia: Secondary | ICD-10-CM | POA: Diagnosis not present

## 2021-01-07 DIAGNOSIS — R945 Abnormal results of liver function studies: Secondary | ICD-10-CM | POA: Diagnosis not present

## 2021-01-07 DIAGNOSIS — Z9981 Dependence on supplemental oxygen: Secondary | ICD-10-CM | POA: Diagnosis not present

## 2021-01-08 DIAGNOSIS — N179 Acute kidney failure, unspecified: Secondary | ICD-10-CM | POA: Diagnosis not present

## 2021-01-08 DIAGNOSIS — D509 Iron deficiency anemia, unspecified: Secondary | ICD-10-CM | POA: Diagnosis not present

## 2021-01-08 DIAGNOSIS — R945 Abnormal results of liver function studies: Secondary | ICD-10-CM | POA: Diagnosis not present

## 2021-01-08 DIAGNOSIS — E870 Hyperosmolality and hypernatremia: Secondary | ICD-10-CM | POA: Diagnosis not present

## 2021-01-08 DIAGNOSIS — G9340 Encephalopathy, unspecified: Secondary | ICD-10-CM | POA: Diagnosis not present

## 2021-01-08 DIAGNOSIS — R1310 Dysphagia, unspecified: Secondary | ICD-10-CM | POA: Diagnosis not present

## 2021-01-08 DIAGNOSIS — J9621 Acute and chronic respiratory failure with hypoxia: Secondary | ICD-10-CM | POA: Diagnosis not present

## 2021-01-08 DIAGNOSIS — Z9981 Dependence on supplemental oxygen: Secondary | ICD-10-CM | POA: Diagnosis not present

## 2021-01-09 DIAGNOSIS — D509 Iron deficiency anemia, unspecified: Secondary | ICD-10-CM | POA: Diagnosis not present

## 2021-01-09 DIAGNOSIS — G9340 Encephalopathy, unspecified: Secondary | ICD-10-CM | POA: Diagnosis not present

## 2021-01-09 DIAGNOSIS — J9621 Acute and chronic respiratory failure with hypoxia: Secondary | ICD-10-CM | POA: Diagnosis not present

## 2021-01-09 DIAGNOSIS — N179 Acute kidney failure, unspecified: Secondary | ICD-10-CM | POA: Diagnosis not present

## 2021-01-09 DIAGNOSIS — I5040 Unspecified combined systolic (congestive) and diastolic (congestive) heart failure: Secondary | ICD-10-CM | POA: Diagnosis not present

## 2021-01-09 DIAGNOSIS — I5032 Chronic diastolic (congestive) heart failure: Secondary | ICD-10-CM | POA: Diagnosis not present

## 2021-01-09 DIAGNOSIS — Z9981 Dependence on supplemental oxygen: Secondary | ICD-10-CM | POA: Diagnosis not present

## 2021-01-10 DIAGNOSIS — E871 Hypo-osmolality and hyponatremia: Secondary | ICD-10-CM | POA: Diagnosis not present

## 2021-01-10 DIAGNOSIS — E860 Dehydration: Secondary | ICD-10-CM | POA: Diagnosis not present

## 2021-01-10 DIAGNOSIS — I272 Pulmonary hypertension, unspecified: Secondary | ICD-10-CM | POA: Diagnosis not present

## 2021-01-10 DIAGNOSIS — R279 Unspecified lack of coordination: Secondary | ICD-10-CM | POA: Diagnosis not present

## 2021-01-10 DIAGNOSIS — G9341 Metabolic encephalopathy: Secondary | ICD-10-CM | POA: Diagnosis not present

## 2021-01-10 DIAGNOSIS — I959 Hypotension, unspecified: Secondary | ICD-10-CM | POA: Diagnosis not present

## 2021-01-10 DIAGNOSIS — K219 Gastro-esophageal reflux disease without esophagitis: Secondary | ICD-10-CM | POA: Diagnosis not present

## 2021-01-10 DIAGNOSIS — K7200 Acute and subacute hepatic failure without coma: Secondary | ICD-10-CM | POA: Diagnosis not present

## 2021-01-10 DIAGNOSIS — J9811 Atelectasis: Secondary | ICD-10-CM | POA: Diagnosis not present

## 2021-01-10 DIAGNOSIS — I5033 Acute on chronic diastolic (congestive) heart failure: Secondary | ICD-10-CM | POA: Diagnosis not present

## 2021-01-10 DIAGNOSIS — I509 Heart failure, unspecified: Secondary | ICD-10-CM | POA: Diagnosis not present

## 2021-01-10 DIAGNOSIS — I2510 Atherosclerotic heart disease of native coronary artery without angina pectoris: Secondary | ICD-10-CM | POA: Diagnosis not present

## 2021-01-10 DIAGNOSIS — Z515 Encounter for palliative care: Secondary | ICD-10-CM | POA: Diagnosis not present

## 2021-01-10 DIAGNOSIS — I130 Hypertensive heart and chronic kidney disease with heart failure and stage 1 through stage 4 chronic kidney disease, or unspecified chronic kidney disease: Secondary | ICD-10-CM | POA: Diagnosis not present

## 2021-01-10 DIAGNOSIS — F419 Anxiety disorder, unspecified: Secondary | ICD-10-CM | POA: Diagnosis not present

## 2021-01-10 DIAGNOSIS — Z9981 Dependence on supplemental oxygen: Secondary | ICD-10-CM | POA: Diagnosis not present

## 2021-01-10 DIAGNOSIS — Z45010 Encounter for checking and testing of cardiac pacemaker pulse generator [battery]: Secondary | ICD-10-CM | POA: Diagnosis not present

## 2021-01-10 DIAGNOSIS — J1008 Influenza due to other identified influenza virus with other specified pneumonia: Secondary | ICD-10-CM | POA: Diagnosis not present

## 2021-01-10 DIAGNOSIS — Z743 Need for continuous supervision: Secondary | ICD-10-CM | POA: Diagnosis not present

## 2021-01-10 DIAGNOSIS — K8020 Calculus of gallbladder without cholecystitis without obstruction: Secondary | ICD-10-CM | POA: Diagnosis not present

## 2021-01-10 DIAGNOSIS — D638 Anemia in other chronic diseases classified elsewhere: Secondary | ICD-10-CM | POA: Diagnosis not present

## 2021-01-10 DIAGNOSIS — R945 Abnormal results of liver function studies: Secondary | ICD-10-CM | POA: Diagnosis not present

## 2021-01-10 DIAGNOSIS — I480 Paroxysmal atrial fibrillation: Secondary | ICD-10-CM | POA: Diagnosis not present

## 2021-01-10 DIAGNOSIS — Z20822 Contact with and (suspected) exposure to covid-19: Secondary | ICD-10-CM | POA: Diagnosis not present

## 2021-01-10 DIAGNOSIS — J690 Pneumonitis due to inhalation of food and vomit: Secondary | ICD-10-CM | POA: Diagnosis not present

## 2021-01-10 DIAGNOSIS — J9620 Acute and chronic respiratory failure, unspecified whether with hypoxia or hypercapnia: Secondary | ICD-10-CM | POA: Diagnosis not present

## 2021-01-10 DIAGNOSIS — D509 Iron deficiency anemia, unspecified: Secondary | ICD-10-CM | POA: Diagnosis not present

## 2021-01-10 DIAGNOSIS — J90 Pleural effusion, not elsewhere classified: Secondary | ICD-10-CM | POA: Diagnosis not present

## 2021-01-10 DIAGNOSIS — N1831 Chronic kidney disease, stage 3a: Secondary | ICD-10-CM | POA: Diagnosis not present

## 2021-01-10 DIAGNOSIS — I700 Atherosclerosis of aorta: Secondary | ICD-10-CM | POA: Diagnosis not present

## 2021-01-10 DIAGNOSIS — J9621 Acute and chronic respiratory failure with hypoxia: Secondary | ICD-10-CM | POA: Diagnosis not present

## 2021-01-10 DIAGNOSIS — D649 Anemia, unspecified: Secondary | ICD-10-CM | POA: Diagnosis not present

## 2021-01-10 DIAGNOSIS — M6281 Muscle weakness (generalized): Secondary | ICD-10-CM | POA: Diagnosis not present

## 2021-01-10 DIAGNOSIS — R262 Difficulty in walking, not elsewhere classified: Secondary | ICD-10-CM | POA: Diagnosis not present

## 2021-01-10 DIAGNOSIS — D72829 Elevated white blood cell count, unspecified: Secondary | ICD-10-CM | POA: Diagnosis not present

## 2021-01-10 DIAGNOSIS — R6521 Severe sepsis with septic shock: Secondary | ICD-10-CM | POA: Diagnosis not present

## 2021-01-10 DIAGNOSIS — G2581 Restless legs syndrome: Secondary | ICD-10-CM | POA: Diagnosis not present

## 2021-01-10 DIAGNOSIS — E872 Acidosis: Secondary | ICD-10-CM | POA: Diagnosis not present

## 2021-01-10 DIAGNOSIS — D65 Disseminated intravascular coagulation [defibrination syndrome]: Secondary | ICD-10-CM | POA: Diagnosis not present

## 2021-01-10 DIAGNOSIS — M1990 Unspecified osteoarthritis, unspecified site: Secondary | ICD-10-CM | POA: Diagnosis not present

## 2021-01-10 DIAGNOSIS — I5032 Chronic diastolic (congestive) heart failure: Secondary | ICD-10-CM | POA: Diagnosis not present

## 2021-01-10 DIAGNOSIS — J9600 Acute respiratory failure, unspecified whether with hypoxia or hypercapnia: Secondary | ICD-10-CM | POA: Diagnosis not present

## 2021-01-10 DIAGNOSIS — R0902 Hypoxemia: Secondary | ICD-10-CM | POA: Diagnosis not present

## 2021-01-10 DIAGNOSIS — E870 Hyperosmolality and hypernatremia: Secondary | ICD-10-CM | POA: Diagnosis not present

## 2021-01-10 DIAGNOSIS — R1310 Dysphagia, unspecified: Secondary | ICD-10-CM | POA: Diagnosis not present

## 2021-01-10 DIAGNOSIS — N183 Chronic kidney disease, stage 3 unspecified: Secondary | ICD-10-CM | POA: Diagnosis not present

## 2021-01-10 DIAGNOSIS — R21 Rash and other nonspecific skin eruption: Secondary | ICD-10-CM | POA: Diagnosis not present

## 2021-01-10 DIAGNOSIS — K828 Other specified diseases of gallbladder: Secondary | ICD-10-CM | POA: Diagnosis not present

## 2021-01-10 DIAGNOSIS — A419 Sepsis, unspecified organism: Secondary | ICD-10-CM | POA: Diagnosis not present

## 2021-01-10 DIAGNOSIS — I517 Cardiomegaly: Secondary | ICD-10-CM | POA: Diagnosis not present

## 2021-01-10 DIAGNOSIS — F339 Major depressive disorder, recurrent, unspecified: Secondary | ICD-10-CM | POA: Diagnosis not present

## 2021-01-10 DIAGNOSIS — Z66 Do not resuscitate: Secondary | ICD-10-CM | POA: Diagnosis not present

## 2021-01-10 DIAGNOSIS — E876 Hypokalemia: Secondary | ICD-10-CM | POA: Diagnosis not present

## 2021-01-10 DIAGNOSIS — I5030 Unspecified diastolic (congestive) heart failure: Secondary | ICD-10-CM | POA: Diagnosis not present

## 2021-01-10 DIAGNOSIS — R278 Other lack of coordination: Secondary | ICD-10-CM | POA: Diagnosis not present

## 2021-01-10 DIAGNOSIS — I420 Dilated cardiomyopathy: Secondary | ICD-10-CM | POA: Diagnosis not present

## 2021-01-10 DIAGNOSIS — R2689 Other abnormalities of gait and mobility: Secondary | ICD-10-CM | POA: Diagnosis not present

## 2021-01-10 DIAGNOSIS — N179 Acute kidney failure, unspecified: Secondary | ICD-10-CM | POA: Diagnosis not present

## 2021-01-10 DIAGNOSIS — R579 Shock, unspecified: Secondary | ICD-10-CM | POA: Diagnosis not present

## 2021-01-10 DIAGNOSIS — M419 Scoliosis, unspecified: Secondary | ICD-10-CM | POA: Diagnosis not present

## 2021-01-10 DIAGNOSIS — I495 Sick sinus syndrome: Secondary | ICD-10-CM | POA: Diagnosis not present

## 2021-01-14 DIAGNOSIS — D649 Anemia, unspecified: Secondary | ICD-10-CM | POA: Diagnosis not present

## 2021-01-14 DIAGNOSIS — R262 Difficulty in walking, not elsewhere classified: Secondary | ICD-10-CM | POA: Diagnosis not present

## 2021-01-14 DIAGNOSIS — N1831 Chronic kidney disease, stage 3a: Secondary | ICD-10-CM | POA: Diagnosis not present

## 2021-01-14 DIAGNOSIS — I509 Heart failure, unspecified: Secondary | ICD-10-CM | POA: Diagnosis not present

## 2021-01-20 DIAGNOSIS — D696 Thrombocytopenia, unspecified: Secondary | ICD-10-CM | POA: Diagnosis not present

## 2021-01-20 DIAGNOSIS — R310 Gross hematuria: Secondary | ICD-10-CM | POA: Diagnosis not present

## 2021-01-20 DIAGNOSIS — I420 Dilated cardiomyopathy: Secondary | ICD-10-CM | POA: Diagnosis not present

## 2021-01-20 DIAGNOSIS — Z7901 Long term (current) use of anticoagulants: Secondary | ICD-10-CM | POA: Diagnosis not present

## 2021-01-20 DIAGNOSIS — J9601 Acute respiratory failure with hypoxia: Secondary | ICD-10-CM | POA: Diagnosis not present

## 2021-01-20 DIAGNOSIS — J90 Pleural effusion, not elsewhere classified: Secondary | ICD-10-CM | POA: Diagnosis not present

## 2021-01-20 DIAGNOSIS — S36119A Unspecified injury of liver, initial encounter: Secondary | ICD-10-CM | POA: Diagnosis not present

## 2021-01-20 DIAGNOSIS — N179 Acute kidney failure, unspecified: Secondary | ICD-10-CM | POA: Diagnosis not present

## 2021-01-20 DIAGNOSIS — I340 Nonrheumatic mitral (valve) insufficiency: Secondary | ICD-10-CM | POA: Diagnosis not present

## 2021-01-20 DIAGNOSIS — Z4682 Encounter for fitting and adjustment of non-vascular catheter: Secondary | ICD-10-CM | POA: Diagnosis not present

## 2021-01-20 DIAGNOSIS — Z9911 Dependence on respirator [ventilator] status: Secondary | ICD-10-CM | POA: Diagnosis not present

## 2021-01-20 DIAGNOSIS — I429 Cardiomyopathy, unspecified: Secondary | ICD-10-CM | POA: Diagnosis not present

## 2021-01-20 DIAGNOSIS — I959 Hypotension, unspecified: Secondary | ICD-10-CM | POA: Diagnosis not present

## 2021-01-20 DIAGNOSIS — D62 Acute posthemorrhagic anemia: Secondary | ICD-10-CM | POA: Diagnosis not present

## 2021-01-20 DIAGNOSIS — J811 Chronic pulmonary edema: Secondary | ICD-10-CM | POA: Diagnosis not present

## 2021-01-20 DIAGNOSIS — R7401 Elevation of levels of liver transaminase levels: Secondary | ICD-10-CM | POA: Diagnosis not present

## 2021-01-20 DIAGNOSIS — J9602 Acute respiratory failure with hypercapnia: Secondary | ICD-10-CM | POA: Diagnosis not present

## 2021-01-20 DIAGNOSIS — R609 Edema, unspecified: Secondary | ICD-10-CM | POA: Diagnosis not present

## 2021-01-20 DIAGNOSIS — N183 Chronic kidney disease, stage 3 unspecified: Secondary | ICD-10-CM | POA: Diagnosis not present

## 2021-01-20 DIAGNOSIS — R4182 Altered mental status, unspecified: Secondary | ICD-10-CM | POA: Diagnosis not present

## 2021-01-20 DIAGNOSIS — L501 Idiopathic urticaria: Secondary | ICD-10-CM | POA: Diagnosis not present

## 2021-01-20 DIAGNOSIS — D649 Anemia, unspecified: Secondary | ICD-10-CM | POA: Diagnosis not present

## 2021-01-20 DIAGNOSIS — R1310 Dysphagia, unspecified: Secondary | ICD-10-CM | POA: Diagnosis not present

## 2021-01-20 DIAGNOSIS — J1008 Influenza due to other identified influenza virus with other specified pneumonia: Secondary | ICD-10-CM | POA: Diagnosis not present

## 2021-01-20 DIAGNOSIS — J9621 Acute and chronic respiratory failure with hypoxia: Secondary | ICD-10-CM | POA: Diagnosis not present

## 2021-01-20 DIAGNOSIS — Z950 Presence of cardiac pacemaker: Secondary | ICD-10-CM | POA: Diagnosis not present

## 2021-01-20 DIAGNOSIS — I482 Chronic atrial fibrillation, unspecified: Secondary | ICD-10-CM | POA: Diagnosis not present

## 2021-01-20 DIAGNOSIS — D689 Coagulation defect, unspecified: Secondary | ICD-10-CM | POA: Diagnosis not present

## 2021-01-20 DIAGNOSIS — R578 Other shock: Secondary | ICD-10-CM | POA: Diagnosis not present

## 2021-01-20 DIAGNOSIS — K828 Other specified diseases of gallbladder: Secondary | ICD-10-CM | POA: Diagnosis not present

## 2021-01-20 DIAGNOSIS — I517 Cardiomegaly: Secondary | ICD-10-CM | POA: Diagnosis not present

## 2021-01-20 DIAGNOSIS — K8020 Calculus of gallbladder without cholecystitis without obstruction: Secondary | ICD-10-CM | POA: Diagnosis not present

## 2021-01-20 DIAGNOSIS — E440 Moderate protein-calorie malnutrition: Secondary | ICD-10-CM | POA: Diagnosis not present

## 2021-01-20 DIAGNOSIS — K219 Gastro-esophageal reflux disease without esophagitis: Secondary | ICD-10-CM | POA: Diagnosis not present

## 2021-01-20 DIAGNOSIS — J189 Pneumonia, unspecified organism: Secondary | ICD-10-CM | POA: Diagnosis not present

## 2021-01-20 DIAGNOSIS — R579 Shock, unspecified: Secondary | ICD-10-CM | POA: Diagnosis not present

## 2021-01-20 DIAGNOSIS — Z515 Encounter for palliative care: Secondary | ICD-10-CM | POA: Diagnosis not present

## 2021-01-20 DIAGNOSIS — D65 Disseminated intravascular coagulation [defibrination syndrome]: Secondary | ICD-10-CM | POA: Diagnosis not present

## 2021-01-20 DIAGNOSIS — I130 Hypertensive heart and chronic kidney disease with heart failure and stage 1 through stage 4 chronic kidney disease, or unspecified chronic kidney disease: Secondary | ICD-10-CM | POA: Diagnosis not present

## 2021-01-20 DIAGNOSIS — E871 Hypo-osmolality and hyponatremia: Secondary | ICD-10-CM | POA: Diagnosis not present

## 2021-01-20 DIAGNOSIS — R6521 Severe sepsis with septic shock: Secondary | ICD-10-CM | POA: Diagnosis not present

## 2021-01-20 DIAGNOSIS — Z66 Do not resuscitate: Secondary | ICD-10-CM | POA: Diagnosis not present

## 2021-01-20 DIAGNOSIS — N189 Chronic kidney disease, unspecified: Secondary | ICD-10-CM | POA: Diagnosis not present

## 2021-01-20 DIAGNOSIS — A419 Sepsis, unspecified organism: Secondary | ICD-10-CM | POA: Diagnosis not present

## 2021-01-20 DIAGNOSIS — I1310 Hypertensive heart and chronic kidney disease without heart failure, with stage 1 through stage 4 chronic kidney disease, or unspecified chronic kidney disease: Secondary | ICD-10-CM | POA: Diagnosis not present

## 2021-01-20 DIAGNOSIS — E872 Acidosis: Secondary | ICD-10-CM | POA: Diagnosis not present

## 2021-01-20 DIAGNOSIS — R109 Unspecified abdominal pain: Secondary | ICD-10-CM | POA: Diagnosis not present

## 2021-01-20 DIAGNOSIS — D589 Hereditary hemolytic anemia, unspecified: Secondary | ICD-10-CM | POA: Diagnosis not present

## 2021-01-20 DIAGNOSIS — E162 Hypoglycemia, unspecified: Secondary | ICD-10-CM | POA: Diagnosis not present

## 2021-01-20 DIAGNOSIS — I272 Pulmonary hypertension, unspecified: Secondary | ICD-10-CM | POA: Diagnosis not present

## 2021-01-20 DIAGNOSIS — R21 Rash and other nonspecific skin eruption: Secondary | ICD-10-CM | POA: Diagnosis not present

## 2021-01-20 DIAGNOSIS — L308 Other specified dermatitis: Secondary | ICD-10-CM | POA: Diagnosis not present

## 2021-01-20 DIAGNOSIS — G9341 Metabolic encephalopathy: Secondary | ICD-10-CM | POA: Diagnosis not present

## 2021-01-20 DIAGNOSIS — J9811 Atelectasis: Secondary | ICD-10-CM | POA: Diagnosis not present

## 2021-01-20 DIAGNOSIS — Z20822 Contact with and (suspected) exposure to covid-19: Secondary | ICD-10-CM | POA: Diagnosis not present

## 2021-01-20 DIAGNOSIS — M1990 Unspecified osteoarthritis, unspecified site: Secondary | ICD-10-CM | POA: Diagnosis not present

## 2021-01-20 DIAGNOSIS — E876 Hypokalemia: Secondary | ICD-10-CM | POA: Diagnosis not present

## 2021-01-20 DIAGNOSIS — R0902 Hypoxemia: Secondary | ICD-10-CM | POA: Diagnosis not present

## 2021-01-20 DIAGNOSIS — G9340 Encephalopathy, unspecified: Secondary | ICD-10-CM | POA: Diagnosis not present

## 2021-01-20 DIAGNOSIS — E870 Hyperosmolality and hypernatremia: Secondary | ICD-10-CM | POA: Diagnosis not present

## 2021-01-20 DIAGNOSIS — M419 Scoliosis, unspecified: Secondary | ICD-10-CM | POA: Diagnosis not present

## 2021-01-20 DIAGNOSIS — I2510 Atherosclerotic heart disease of native coronary artery without angina pectoris: Secondary | ICD-10-CM | POA: Diagnosis not present

## 2021-01-20 DIAGNOSIS — D72829 Elevated white blood cell count, unspecified: Secondary | ICD-10-CM | POA: Diagnosis not present

## 2021-01-20 DIAGNOSIS — G2581 Restless legs syndrome: Secondary | ICD-10-CM | POA: Diagnosis not present

## 2021-01-20 DIAGNOSIS — I4891 Unspecified atrial fibrillation: Secondary | ICD-10-CM | POA: Diagnosis not present

## 2021-01-20 DIAGNOSIS — D638 Anemia in other chronic diseases classified elsewhere: Secondary | ICD-10-CM | POA: Diagnosis not present

## 2021-01-20 DIAGNOSIS — E631 Imbalance of constituents of food intake: Secondary | ICD-10-CM | POA: Diagnosis not present

## 2021-01-20 DIAGNOSIS — I495 Sick sinus syndrome: Secondary | ICD-10-CM | POA: Diagnosis not present

## 2021-01-20 DIAGNOSIS — I700 Atherosclerosis of aorta: Secondary | ICD-10-CM | POA: Diagnosis not present

## 2021-01-20 DIAGNOSIS — I480 Paroxysmal atrial fibrillation: Secondary | ICD-10-CM | POA: Diagnosis not present

## 2021-01-20 DIAGNOSIS — E860 Dehydration: Secondary | ICD-10-CM | POA: Diagnosis not present

## 2021-01-20 DIAGNOSIS — I878 Other specified disorders of veins: Secondary | ICD-10-CM | POA: Diagnosis not present

## 2021-01-20 DIAGNOSIS — K7200 Acute and subacute hepatic failure without coma: Secondary | ICD-10-CM | POA: Diagnosis not present

## 2021-01-20 DIAGNOSIS — D841 Defects in the complement system: Secondary | ICD-10-CM | POA: Diagnosis not present

## 2021-01-20 DIAGNOSIS — J690 Pneumonitis due to inhalation of food and vomit: Secondary | ICD-10-CM | POA: Diagnosis not present

## 2021-01-20 DIAGNOSIS — Z9981 Dependence on supplemental oxygen: Secondary | ICD-10-CM | POA: Diagnosis not present

## 2021-01-20 DIAGNOSIS — I5032 Chronic diastolic (congestive) heart failure: Secondary | ICD-10-CM | POA: Diagnosis not present

## 2021-01-27 ENCOUNTER — Ambulatory Visit: Payer: PPO | Admitting: Family Medicine

## 2021-02-09 DEATH — deceased

## 2021-06-02 IMAGING — CT CT BRAIN WO CONTRAST
3 of 4 series · 14 of 47 positions shown, 16 images · non-contrast
Comparison: None

HISTORY: 78-year-old Female with impaired memory.
TECHNIQUE: CT of the head was obtained without contrast.

[Series 2: brain · axial · 0.35mm/px · z∈[-669,-540]mm · 8 of 54 slices shown, 10 images]
[im 4/54  brain]
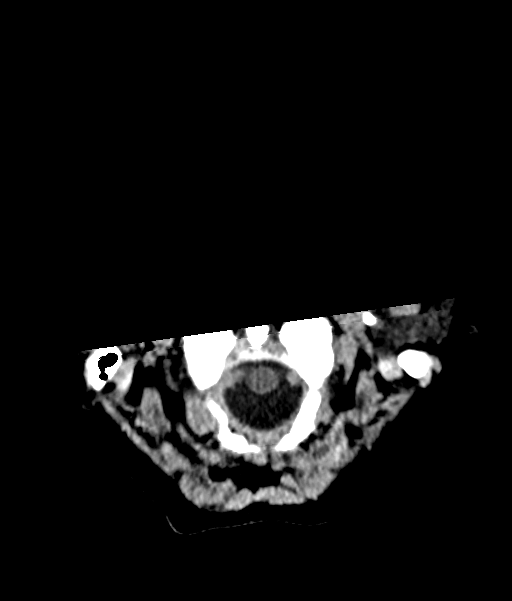
[im 4/54  bone]
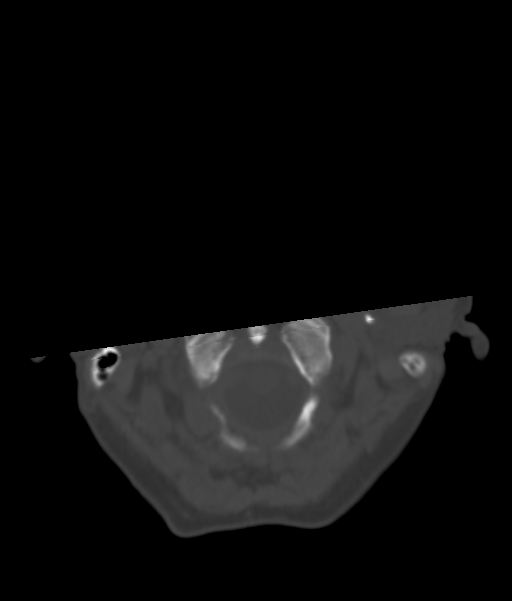
[im 12/54  brain]
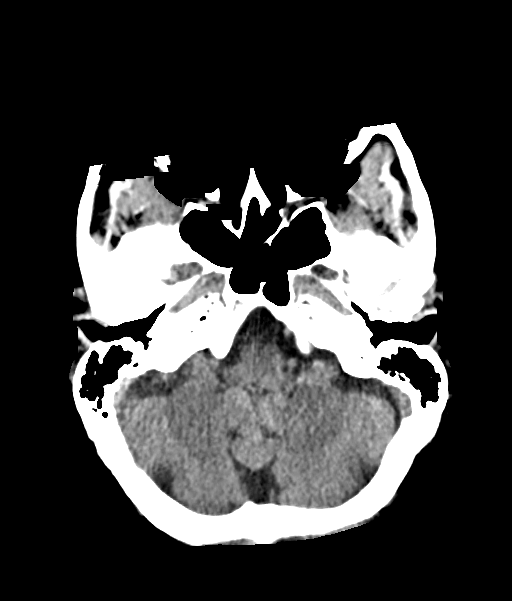
[im 19/54  brain]
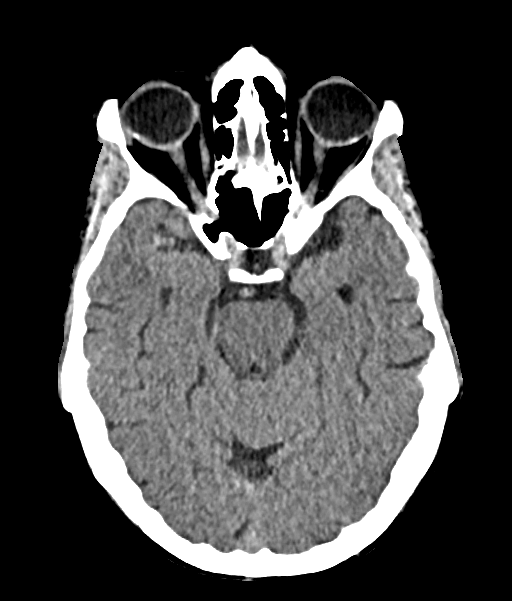
[im 23/54  brain]
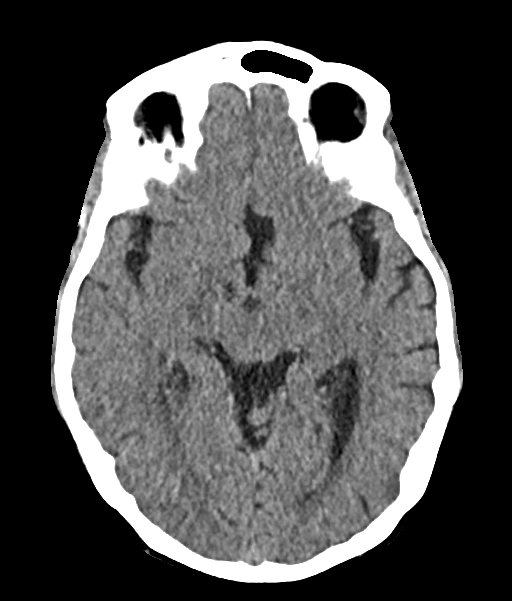
[im 31/54  brain]
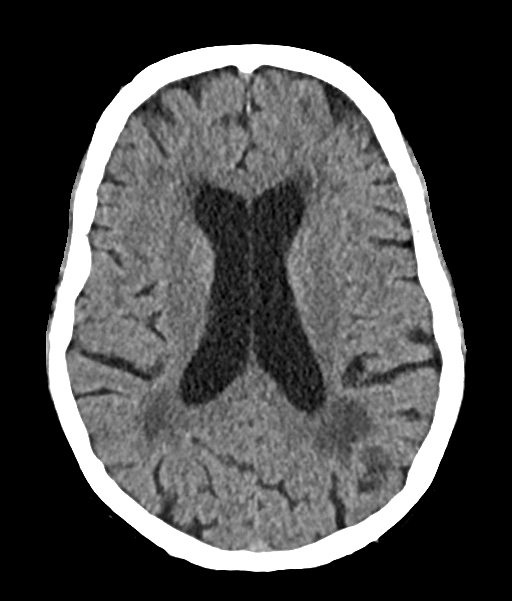
[im 31/54  bone]
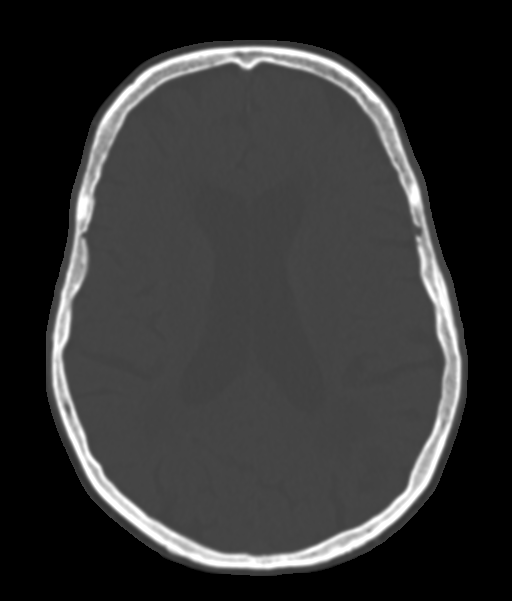
[im 35/54  brain]
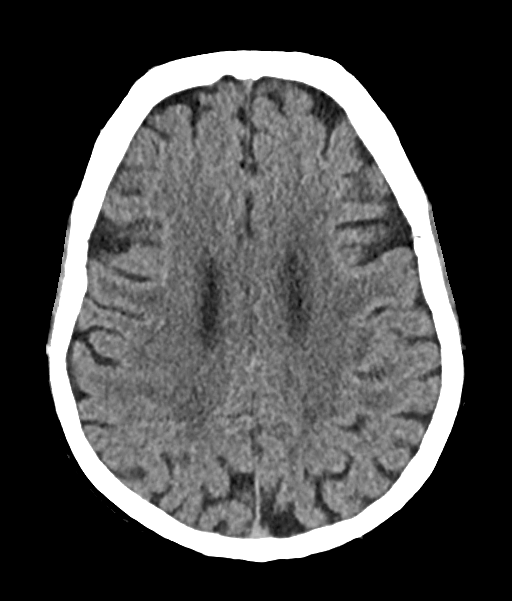
[im 42/54  brain]
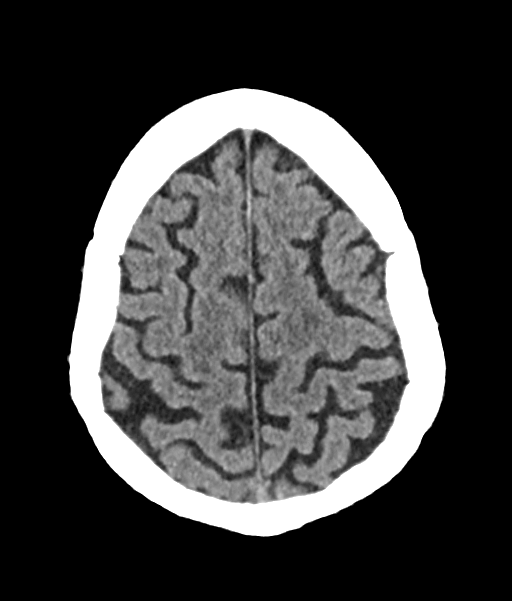
[im 50/54  brain]
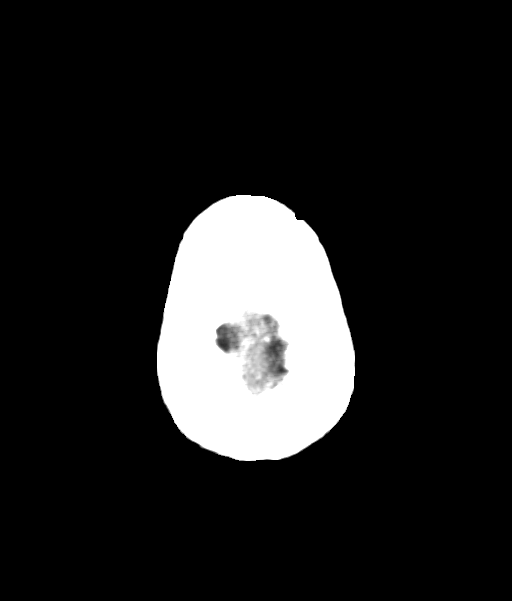

[Series 6: coronal brain · coronal · 0.32mm/px · 3 of 64 slices shown]
[im 22/64  brain]
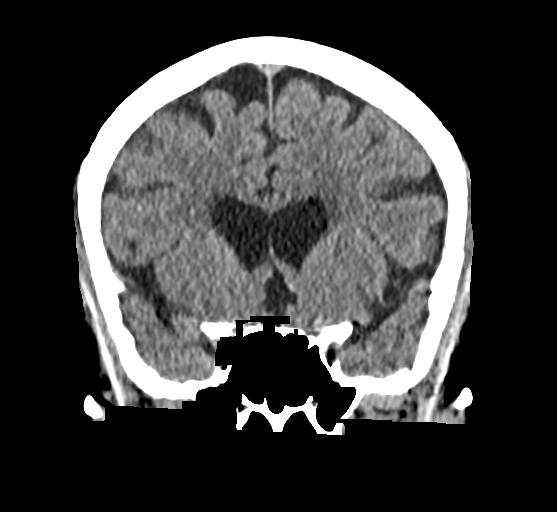
[im 29/64  brain]
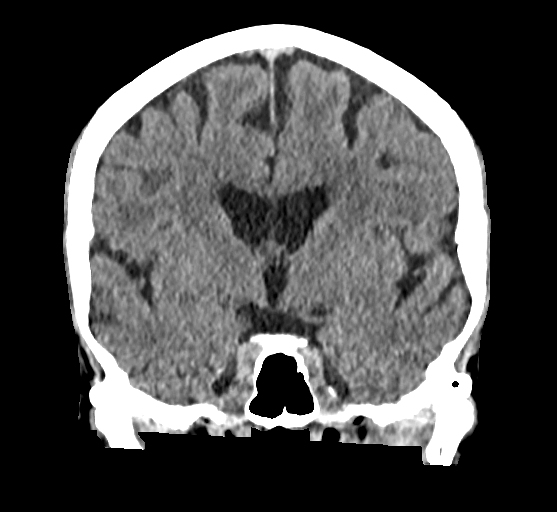
[im 36/64  brain]
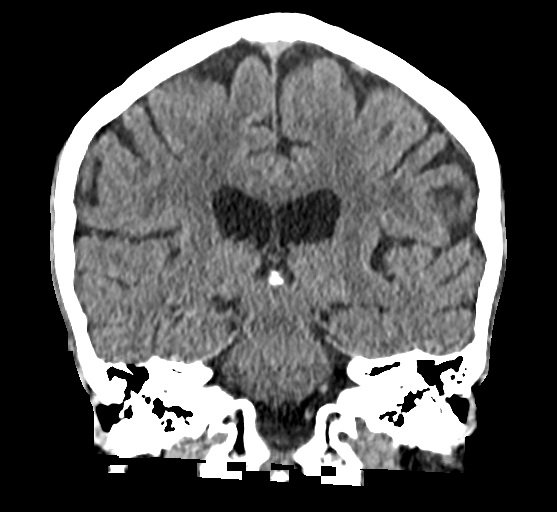

[Series 8: sagittal brain · sagittal · 0.32mm/px · 3 of 51 slices shown]
[im 17/51  brain]
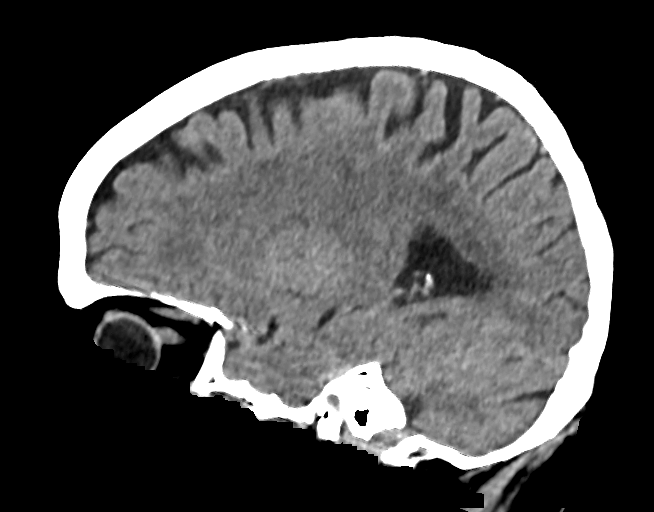
[im 26/51  brain]
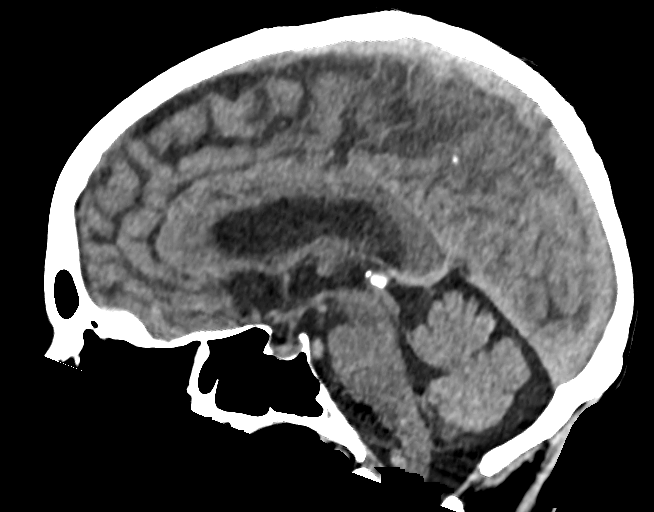
[im 34/51  brain]
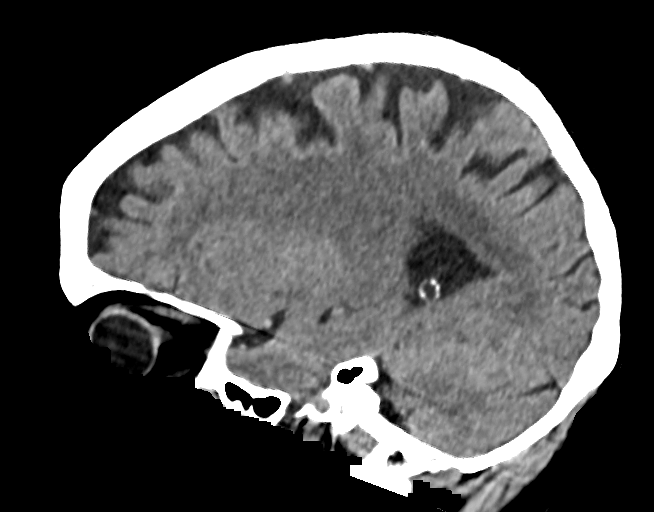

[14 of 47 positions shown; findings below may reference images not displayed]

FINDINGS: DEMENTIA SPECIFIC FINDINGS:

GLOBAL VOLUME LOSS: Moderate.

HIPPOCAMPAL ATROPHY: MTA grade 2 on the right, MTA grade 2 on the left.

POSTERIOR PARIETAL ATROPHY: Tiger grade 1 on the right, Tiger grade 1 on the left.

ROCHELLE ATROPHY: None.

CHRONIC MICROANGIOPATHIC CHANGES: Severe (Ceejay 3).

VENTRICLES: No disproportional enlargement of the ventricular system to the degree of sulcal enlargement. The callosal angle is not significantly decreased.

OTHER: No intracranial mass, midline shift, hydrocephalus, or extra-axial collection.

BONES: Unremarkable.
IMPRESSION: 1.
No acute intracranial abnormality.

2.
Moderate global brain atrophy and evidence of severe chronic microangiopathic changes.

Total radiation dose to patient is CTDIvol 33.87 mGy and DLP 601.24 mGy-cm.
